# Patient Record
Sex: Male | Born: 1985 | Race: White | Hispanic: No | Marital: Single | Smoking: Current every day smoker
Health system: Southern US, Community
[De-identification: ages and names within clinical notes are randomized; demographics above are authoritative.]

## PROBLEM LIST (undated history)

## (undated) DIAGNOSIS — L732 Hidradenitis suppurativa: Secondary | ICD-10-CM

## (undated) DIAGNOSIS — K219 Gastro-esophageal reflux disease without esophagitis: Secondary | ICD-10-CM

---

## 2010-01-09 ENCOUNTER — Ambulatory Visit: Payer: Self-pay | Admitting: Family Medicine

## 2010-01-09 DIAGNOSIS — S61209A Unspecified open wound of unspecified finger without damage to nail, initial encounter: Secondary | ICD-10-CM | POA: Insufficient documentation

## 2010-01-19 ENCOUNTER — Ambulatory Visit: Payer: Self-pay | Admitting: Family Medicine

## 2010-12-07 NOTE — Assessment & Plan Note (Signed)
Summary: Cut R middle finger x today proced rm   Vital Signs:  Patient Profile:   25 Years Old Male CC:      Laceration - L middle finger x today Height:     69 inches Weight:      257 pounds O2 Sat:      100 % O2 treatment:    Room Air Temp:     97.5 degrees F oral Pulse rate:   64 / minute Pulse rhythm:   regular Resp:     16 per minute BP sitting:   129 / 75  (right arm) Cuff size:   regular  Vitals Entered By: Areta Haber CMA (January 09, 2010 1:21 PM)                  Current Allergies: No known allergies History of Present Illness Chief Complaint: Laceration - L middle finger x today History of Present Illness: Subjective:  Patient complains of cutting his left 3rd fingertip on the sharp edge of a TV this afternoon.  He does not remember his last Td.  Current Problems: LACERATION OF FINGER (ICD-883.0)   Current Meds LORTAB 5 5-500 MG TABS (HYDROCODONE-ACETAMINOPHEN) One or two tabs by mouth q4 to 6hr as needed pain  REVIEW OF SYSTEMS Constitutional Symptoms      Denies fever, chills, night sweats, weight loss, weight gain, and fatigue.  Eyes       Denies change in vision, eye pain, eye discharge, glasses, contact lenses, and eye surgery. Ear/Nose/Throat/Mouth       Denies hearing loss/aids, change in hearing, ear pain, ear discharge, dizziness, frequent runny nose, frequent nose bleeds, sinus problems, sore throat, hoarseness, and tooth pain or bleeding.  Respiratory       Denies dry cough, productive cough, wheezing, shortness of breath, asthma, bronchitis, and emphysema/COPD.  Cardiovascular       Denies murmurs, chest pain, and tires easily with exhertion.    Gastrointestinal       Denies stomach pain, nausea/vomiting, diarrhea, constipation, blood in bowel movements, and indigestion. Genitourniary       Denies painful urination, kidney stones, and loss of urinary control. Neurological       Denies paralysis, seizures, and  fainting/blackouts. Musculoskeletal       Denies muscle pain, joint pain, joint stiffness, decreased range of motion, redness, swelling, muscle weakness, and gout.  Skin       Denies bruising, unusual mles/lumps or sores, and hair/skin or nail changes.  Psych       Denies mood changes, temper/anger issues, anxiety/stress, speech problems, depression, and sleep problems. Other Comments: Cut Left middle finger moving TV today.   Social History: Single Current Smoker - 1-11/2 packs daily Alcohol use-yes, socially Drug use-no Regular exercise-yes Smoking Status:  current Drug Use:  no Does Patient Exercise:  yes   Objective:  No acute distress  Left 3rd fingertip:  3cm flap laceration volar surface distal phalanx; full range of motion all joints.  Distal neurovascular intact  Assessment New Problems: LACERATION OF FINGER (ICD-883.0)   Plan New Medications/Changes: LORTAB 5 5-500 MG TABS (HYDROCODONE-ACETAMINOPHEN) One or two tabs by mouth q4 to 6hr as needed pain  #10 (ten) x 0, 01/09/2010, Donna Christen MD  New Orders: Tdap => 79yrs IM [90715] Tdap => 53yrs IM [90715] Admin 1st Vaccine [90471] Admin 1st Vaccine [facility]) [phone] Repair Superficial Wound(s) 2.6cm to 7.5cm [12002] New Patient Level III [04540] Planning Comments:   Tdap given Applied Bacitracin  and bandaid. Advised to change bandage daily and apply Bacitracin.  Wound precaustions.  Rx for analgesic.  Suture removal 10 days.   The patient and/or caregiver has been counseled thoroughly with regard to medications prescribed including dosage, schedule, interactions, rationale for use, and possible side effects and they verbalize understanding.  Diagnoses and expected course of recovery discussed and will return if not improved as expected or if the condition worsens. Patient and/or caregiver verbalized understanding.   PROCEDURE:  Suture Site: Left 3rd finger Size: 3cm Number of Lacerations: One Anesthesia:  Digital 2% plain lidocaine Procedure: Procedure:  Laceration Repair Discussed benefits and risks of procedure and verbal consent obtained. Using sterile technique and local digital 2% lidocaine without epinephrine, cleansed wound with Betadine followed by copious lavage with normal saline.  Wound carefully inspected for debris and foreign bodies; none found.  Wound closed with # 7, 4-0 interrupted nylon sutures.  Bacitracin and non-stick sterile dressing applied.  Wound precautions explained to patient.    Disposition: Home Prescriptions: LORTAB 5 5-500 MG TABS (HYDROCODONE-ACETAMINOPHEN) One or two tabs by mouth q4 to 6hr as needed pain  #10 (ten) x 0   Entered and Authorized by:   Donna Christen MD   Signed by:   Donna Christen MD on 01/09/2010   Method used:   Print then Give to Patient   RxID:   [id number]    Tetanus/Td Vaccine    Vaccine Type: Tdap    Site: left deltoid    Mfr: GlaxoSmithKline    Dose: 0.5 ml    Route: IM    Given by: Areta Haber CMA    Exp. Date: 01/02/2012    Lot #: [id number]    VIS given: 09/25/07 version given January 09, 2010.

## 2010-12-07 NOTE — Miscellaneous (Signed)
Summary: Immunizations  Immunizations   Imported By: Joanne Chars CMA 01/09/2010 14:47:52  _____________________________________________________________________  External Attachment:    Type:   Image     Comment:   External Document

## 2010-12-07 NOTE — Assessment & Plan Note (Signed)
Summary: Suture Removal  CHIEF COMPLAINT:Suture removal from left middle finger x 10 days  VITAL SIGNS:     Height:    (Previous:   70      )     Weight:   (Previous:  250       )     Temp: 97.9     BP: 142/75     Pulse:75     Resp:16     02 Sat:99%  ALLERGIES:  NKDA  Past History, Family History, Social History previously recorded   Objective:  Left third finger tip:  mildly tender volar surface but no swelling.  Small amount of blood noted on bandage  Assessment:  ENCOUNTER CHG/REMOVAL NONSURGICAL WOUND DRESSING (ICD-V58.30) ? Mild cellulits  Plan:  CEPHALEXIN 500 MG TABS (CEPHALEXIN) One by mouth three times daily (every 8 hours) Continue daily dressing change with Bacitracin and non-stick dressing Return 4 or 5 days for suture removal.

## 2020-08-26 DIAGNOSIS — M549 Dorsalgia, unspecified: Secondary | ICD-10-CM | POA: Diagnosis not present

## 2020-08-26 DIAGNOSIS — R61 Generalized hyperhidrosis: Secondary | ICD-10-CM | POA: Diagnosis not present

## 2020-08-26 DIAGNOSIS — N5089 Other specified disorders of the male genital organs: Secondary | ICD-10-CM | POA: Diagnosis not present

## 2020-08-28 ENCOUNTER — Other Ambulatory Visit: Payer: Self-pay | Admitting: Family Medicine

## 2020-08-28 DIAGNOSIS — N5089 Other specified disorders of the male genital organs: Secondary | ICD-10-CM

## 2020-09-03 ENCOUNTER — Other Ambulatory Visit: Payer: Self-pay

## 2020-09-03 ENCOUNTER — Ambulatory Visit
Admission: RE | Admit: 2020-09-03 | Discharge: 2020-09-03 | Disposition: A | Discharge status: Home / Self Care | Payer: BC Managed Care – PPO | Source: Ambulatory Visit | Attending: Family Medicine | Admitting: Family Medicine

## 2020-09-03 DIAGNOSIS — N5089 Other specified disorders of the male genital organs: Secondary | ICD-10-CM

## 2020-12-31 ENCOUNTER — Ambulatory Visit: Payer: Self-pay | Admitting: General Surgery

## 2020-12-31 NOTE — H&P (Signed)
Brian Hodges Appointment: 12/31/2020 9:45 AM Location: [facility] Patient #: [id number] DOB: [birth year] Single / Language: Brian Hodges / Race: White Male  History of Present Illness Brian Hiss M. Brian Hodges; 12/31/2020 2:46 PM) The patient is a 35 year old male who presents with an inguinal hernia. He is referred by Dr Brian Hodges for evaluation of left inguinal hernia. The patient states that a few months ago he awoke or noticed swelling in his left testicle. He didn't know what had happened. It was just a large bulge in his left scrotum. He saw his primary care doctor and underwent a scrotal ultrasound. There was concern as it could be a large hydrocele. His ultrasound showed no evidence of hydrocele or varicocele and normal testicles. He had left inguinal hernia containing fat and likely bowel. He states occasionally it will cause discomfort. He denies any neuropathic pain. He denies any trouble urinating. He denies any diarrhea or constipation. He states that these had some skin issues in his pubic area in the past. He appears have a small fat-containing umbilical hernia as well.  He does smoke 1 pack per day. He also smokes marijuana occasionally.  He also states that he has had some erectile dysfunction since noticing the bulge. He has tried Viagra without any improvement   Problem List/Past Medical Brian Hiss M. 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 Caffeine use Carbonated beverages, Tea. Illicit drug use Uses daily. Tobacco use Current every day smoker.  Family History Brian Hodges; 12/31/2020 9:53 AM) First Degree Relatives No pertinent family history  Other Problems Brian Hiss M. Redmond Pulling, Hodges; 12/31/2020 2:47 PM) Back Pain     Review of Systems Brian Hiss M. Brian Hodges; 12/31/2020 2:43 PM) General Present- Fatigue and Weight Gain. Not Present- Appetite Loss, Chills, Fever, Night Sweats and Weight Loss. Skin Not Present- Change in Wart/Mole, Dryness, Hives, Jaundice, New Lesions, Non-Healing Wounds, Rash and Ulcer. HEENT Not Present- Earache, Hearing Loss, Hoarseness, Nose Bleed, Oral Ulcers, Ringing in the Ears, Seasonal Allergies, Sinus Pain, Sore Throat, Visual Disturbances, Wears glasses/contact lenses and Yellow Eyes. Respiratory Present- Snoring. Not Present- Bloody sputum, Chronic Cough, Difficulty Breathing and Wheezing. Breast Not Present- Breast Mass, Breast Pain, Nipple Discharge and Skin Changes. Cardiovascular Present- Leg Cramps. Not Present- Chest Pain, Difficulty Breathing Lying Down, Palpitations, Rapid Heart Rate, Shortness of Breath and Swelling of Extremities. Gastrointestinal Not Present- Abdominal Pain, Bloating, Bloody Stool, Change in Bowel Habits, Chronic diarrhea, Constipation, Difficulty Swallowing, Excessive gas, Gets full quickly at meals, Hemorrhoids, Indigestion, Nausea, Rectal Pain and Vomiting. Male Genitourinary Present- Impotence. Not Present- Blood in Urine, Change in Urinary Stream, Frequency, Nocturia, Painful Urination, Urgency and Urine Leakage. Musculoskeletal Present- Back Pain and Joint Stiffness. Not Present- Joint Pain, Muscle Pain, Muscle Weakness and Swelling of Extremities. Neurological Not Present- Decreased Memory, Fainting, Headaches, Numbness, Seizures, Tingling, Tremor, Trouble  walking and Weakness. Hematology Not Present- Blood Thinners, Easy Bruising, Excessive bleeding, Gland problems, HIV and Persistent Infections. All other systems negative  Vitals (Brian Hodges, early years/pre;  12/31/2020 9:54 AM) 12/31/2020 9:54 AM Weight: 285.38 lb Height: 70in Body Surface Area: 2.43 m Body Mass Index: 40.95 kg/m  Temp.: 98.46F  Pulse: 108 (Regular)  P.OX: 98% (Room air) BP: 142/84(Sitting, Left Arm, Standard)        Physical Exam Brian Hiss M. Brian Hodges; 12/31/2020 2:43 PM)  The physical exam findings are as follows: Note:severe obesity, mainly central  General Mental Status-Alert. General Appearance-Consistent with stated age. Hydration-Well hydrated. Voice-Normal.  Integumentary Note: small pimples across abdomen in suprapubic area - several old skin scars from what appears to be healed abscesses/boils  Head and Neck Head-normocephalic, atraumatic with no lesions or palpable masses. Trachea-midline. Thyroid Gland Characteristics - normal size and consistency.  Eye Eyeball - Bilateral-Extraocular movements intact. Sclera/Conjunctiva - Bilateral-No scleral icterus.  Chest and Lung Exam Chest and lung exam reveals -quiet, even and easy respiratory effort with no use of accessory muscles and on auscultation, normal breath sounds, no adventitious sounds and normal vocal resonance. Inspection Chest Wall - Normal. Back - normal.  Breast - Did not examine.  Cardiovascular Cardiovascular examination reveals -normal heart sounds, regular rate and rhythm with no murmurs and normal pedal pulses bilaterally.  Abdomen Inspection Inspection of the abdomen reveals - No Hernias. Skin - Scar - no surgical scars. Palpation/Percussion Palpation and Percussion of the abdomen reveal - Soft, Non Tender, No Rebound tenderness, No Rigidity (guarding) and No hepatosplenomegaly. Auscultation Auscultation of the abdomen reveals - Bowel sounds  normal.  Male Genitourinary Note: large firm L inguinoscrotal mass/bulge - nontender, unable to reduce, palpable L testicle - no masses; no bulge - right inguinal area with valsalva  Peripheral Vascular Upper Extremity Palpation - Pulses bilaterally normal.  Neurologic Neurologic evaluation reveals -alert and oriented x 3 with no impairment of recent or remote memory. Mental Status-Normal.  Neuropsychiatric The patient's mood and affect are described as -normal. Judgment and Insight-insight is appropriate concerning matters relevant to self.  Musculoskeletal Normal Exam - Left-Upper Extremity Strength Normal and Lower Extremity Strength Normal. Normal Exam - Right-Upper Extremity Strength Normal and Lower Extremity Strength Normal.  Lymphatic Head & Neck  General Head & Neck Lymphatics: Bilateral - Description - Normal. Axillary - Did not examine. Femoral & Inguinal - Did not examine.    Assessment & Plan Brian Hiss M. Breaunna Gottlieb Hodges; 12/31/2020 2:47 PM)  INCARCERATED LEFT INGUINAL HERNIA (K40.30) Impression: We discussed the etiology of inguinal hernias. We discussed the signs & symptoms of incarceration & strangulation. We discussed non-operative and operative management. He has a chronically incarcerated fat-containing inguinal hernia.  We discussed both open approaches as well as minimally invasive approaches such as a robotic approach. I discussed the pros and cons of each approach. My concern with an open approach and is he appears to have recurring small skin boils, abscesses in his lower abdomen and suprapubic area. I'm concerned about an open incision being at increased risk for infection therefore I recommended a minimally invasive approach even though he has a large inguinal scrotal hernia which may be a little bit more technically challenging to reduce through a minimally invasive approach  The patient has elected to proceed with robotic repair of chronically  incarcerated left inguinal hernia with mesh  I described the procedure in detail. The patient was given educational material. We discussed the risks and benefits including but not limited to bleeding, infection, chronic inguinal pain, nerve entrapment, hernia recurrence, mesh complications, hematoma formation, urinary retention, injury to the testicle, numbness in the groin, blood clots, injury to  the surrounding structures, and anesthesia risk. We also discussed the typical post operative recovery course, including no heavy lifting for 4-6 weeks. I explained that the likelihood of improvement of their symptoms is good  I explained that his obesity and tobacco use does increase his risk of recurrence  This patient encounter took 39 minutes today to perform the following: take history, perform exam, review outside records, interpret imaging, counsel the patient on their diagnosis and document encounter, findings & plan in the EHR  Current Plans You are being scheduled for surgery- Our schedulers will call youonce we get approval from your worker's compensation agency  You should hear from our office's scheduling department within 7 working days about the location, date, and time of surgery. We try to make accommodations for patient's preferences in scheduling surgery, but sometimes the OR schedule or the surgeon's schedule prevents [place] from making those accommodations.  If you have not heard from our office [phone]) in 7 working days, call the office and ask for your surgeon's nurse.  If you have other questions about your diagnosis, plan, or surgery, call the office and ask for your surgeon's nurse.   TOBACCO USE (Z72.0) Impression: We discussed the importance of tobacco cessation with respect to infection, hernia recurrence. He was given Neurosurgeon  Current Plans Pt Education - Smoking: Ways to Quit: tobacco  SEVERE OBESITY (E66.01)   OTHER MALE ERECTILE DYSFUNCTION  (N52.8) Impression: I don't know if it is inguinal hernia is causing his erectile dysfunction I think he would be unlikely. I think they're more likely causes. I encouraged him to discuss it with his PCP and consider referral to urology.  Leighton Ruff. Redmond Pulling, Hodges, FACS General, Bariatric, & Minimally Invasive Surgery [facility], [place]

## 2021-02-18 NOTE — Patient Instructions (Addendum)
DUE TO COVID-19 ONLY ONE VISITOR IS ALLOWED TO COME WITH YOU AND STAY IN THE WAITING ROOM ONLY DURING PRE OP AND PROCEDURE DAY OF SURGERY. THE 1 VISITOR  MAY VISIT WITH YOU AFTER SURGERY IN YOUR PRIVATE ROOM DURING VISITING HOURS ONLY!  YOU NEED TO HAVE A COVID 19 TEST ON    4/26___ @_3 :05______, THIS TEST MUST BE DONE BEFORE SURGERY,  COVID TESTING SITE [place] [address], IT IS ON THE RIGHT GOING OUT [address] APPROXIMATELY  2 MINUTES PAST [employer] ON THE RIGHT. ONCE YOUR COVID TEST IS COMPLETED,  PLEASE BEGIN THE QUARANTINE INSTRUCTIONS AS OUTLINED IN YOUR HANDOUT.                [place]    Your procedure is scheduled on: 03/04/21   Report to [facility] Main  Entrance   Report to admitting at  6:30 AM     Call this number if you have problems the morning of surgery [place], NO CHEWING GUM [place].   No food after midnight.    You may have clear liquid until 5:30 AM.    At 5:00 AM drink pre surgery drink.   Nothing by mouth after 5:30 AM.  none Take these medicines the morning of surgery with A SIP OF WATER:   DO NOT TAKE ANY DIABETIC MEDICATIONS DAY OF YOUR SURGERY                               You may not have any metal on your body including              piercings  Do not wear jewelry,  lotions, powders or deodorant              Men may shave face and neck.   Do not bring valuables to the hospital. [place].  Contacts, dentures or bridgework may not be worn into surgery.      Patients discharged the day of surgery will not be allowed to drive home.  IF YOU ARE HAVING SURGERY AND GOING HOME THE SAME DAY, YOU MUST HAVE AN ADULT TO DRIVE YOU HOME AND BE WITH YOU FOR 24 HOURS. YOU MAY GO HOME BY TAXI OR UBER OR ORTHERWISE, BUT AN ADULT MUST ACCOMPANY YOU HOME AND STAY WITH YOU FOR 24  HOURS.  Name and phone number of your driver:  Special Instructions: N/A              Please read over the following fact sheets you were given: _____________________________________________________________________             [facility] - Preparing for Surgery Before surgery, you can play an important role.  Because skin is not sterile, your skin needs to be as free of germs as possible.  You can reduce the number of germs on your skin by washing with CHG (chlorahexidine gluconate) soap before surgery.  CHG is an antiseptic cleaner which kills germs and bonds with the skin to continue killing germs even after washing. Please DO NOT use if you have an allergy to CHG or antibacterial soaps.  If your skin becomes reddened/irritated stop using the CHG and inform your nurse  when you arrive at Short Stay. You may shave your face/neck. Please follow these instructions carefully:   1.  Shower with CHG Soap the night before surgery and the  morning of Surgery.  2.  If you choose to wash your hair, wash your hair first as usual with your  normal  shampoo.  3.  After you shampoo, rinse your hair and body thoroughly to remove the  shampoo.                                        4.  Use CHG as you would any other liquid soap.  You can apply chg directly  to the skin and wash                       Gently with a scrungie or clean washcloth.  5.  Apply the CHG Soap to your body ONLY FROM THE NECK DOWN.   Do not use on face/ open                           Wound or open sores. Avoid contact with eyes, ears mouth and genitals (private parts).                       Wash face,  Genitals (private parts) with your normal soap.             6.  Wash thoroughly, paying special attention to the area where your surgery  will be performed.  7.  Thoroughly rinse your body with warm water from the neck down.  8.  DO NOT shower/wash with your normal soap after using and rinsing off  the CHG Soap.             9.  Pat  yourself dry with a clean towel.            10.  Wear clean pajamas.            11.  Place clean sheets on your bed the night of your first shower and do not  sleep with pets. Day of Surgery : Do not apply any lotions/deodorants the morning of surgery.  Please wear clean clothes to the hospital/surgery center.  FAILURE TO FOLLOW THESE INSTRUCTIONS MAY RESULT IN THE CANCELLATION OF YOUR SURGERY PATIENT SIGNATURE_________________________________  NURSE SIGNATURE__________________________________  ________________________________________________________________________

## 2021-02-22 ENCOUNTER — Other Ambulatory Visit: Payer: Self-pay

## 2021-02-22 ENCOUNTER — Encounter (HOSPITAL_COMMUNITY): Payer: Self-pay

## 2021-02-22 ENCOUNTER — Encounter (HOSPITAL_COMMUNITY)
Admission: RE | Admit: 2021-02-22 | Discharge: 2021-02-22 | Disposition: A | Discharge status: Home / Self Care | Payer: 59 | Source: Ambulatory Visit | Attending: General Surgery | Admitting: General Surgery

## 2021-02-22 DIAGNOSIS — Z01812 Encounter for preprocedural laboratory examination: Secondary | ICD-10-CM | POA: Diagnosis present

## 2021-02-22 HISTORY — DX: Gastro-esophageal reflux disease without esophagitis: K21.9

## 2021-02-22 LAB — CBC WITH DIFFERENTIAL/PLATELET
Abs Immature Granulocytes: 0.01 10*3/uL (ref 0.00–0.07)
Basophils Absolute: 0.1 10*3/uL (ref 0.0–0.1)
Basophils Relative: 1 %
Eosinophils Absolute: 0.4 10*3/uL (ref 0.0–0.5)
Eosinophils Relative: 4 %
HCT: 43.4 % (ref 39.0–52.0)
Hemoglobin: 13.7 g/dL (ref 13.0–17.0)
Immature Granulocytes: 0 %
Lymphocytes Relative: 31 %
Lymphs Abs: 2.8 10*3/uL (ref 0.7–4.0)
MCH: 28.8 pg (ref 26.0–34.0)
MCHC: 31.6 g/dL (ref 30.0–36.0)
MCV: 91.2 fL (ref 80.0–100.0)
Monocytes Absolute: 0.6 10*3/uL (ref 0.1–1.0)
Monocytes Relative: 7 %
Neutro Abs: 5.1 10*3/uL (ref 1.7–7.7)
Neutrophils Relative %: 57 %
Platelets: 286 10*3/uL (ref 150–400)
RBC: 4.76 MIL/uL (ref 4.22–5.81)
RDW: 13.2 % (ref 11.5–15.5)
WBC: 9 10*3/uL (ref 4.0–10.5)
nRBC: 0 % (ref 0.0–0.2)

## 2021-02-22 LAB — BASIC METABOLIC PANEL
Anion gap: 10 (ref 5–15)
BUN: 14 mg/dL (ref 6–20)
CO2: 26 mmol/L (ref 22–32)
Calcium: 9.3 mg/dL (ref 8.9–10.3)
Chloride: 103 mmol/L (ref 98–111)
Creatinine, Ser: 0.88 mg/dL (ref 0.61–1.24)
GFR, Estimated: >60 mL/min (ref >60)
Glucose, Bld: 120 mg/dL — ABNORMAL HIGH (ref 70–99)
Potassium: 4.2 mmol/L (ref 3.5–5.1)
Sodium: 139 mmol/L (ref 135–145)

## 2021-02-22 NOTE — Progress Notes (Signed)
COVID Vaccine Completed:Yes Date COVID Vaccine completed:10/2020 COVID vaccine manufacturer: Pfizer    PCP - Dr. Leia Alf Cardiologist - none  Chest x-ray - no EKG - no Stress Test - no ECHO - no Cardiac Cath - no Pacemaker/ICD device last checked:NA  Sleep Study - no CPAP -   Fasting Blood Sugar - NA Checks Blood Sugar _____ times a day  Blood Thinner Instructions:NA Aspirin Instructions: Last Dose:  Anesthesia review:   Patient denies shortness of breath, fever, cough and chest pain at PAT appointment yes  Patient verbalized understanding of instructions that were given to them at the PAT appointment. Patient was also instructed that they will need to review over the PAT instructions again at home before surgery.yes  Pt can climb 3 flights of stairs, do work around the house and ADLs with out SOB. He reports a 12 Lb weight gain over the last 2 years and he hasn't been able to work out because of the hernias

## 2021-03-01 ENCOUNTER — Other Ambulatory Visit (HOSPITAL_COMMUNITY)
Admission: RE | Admit: 2021-03-01 | Discharge: 2021-03-01 | Disposition: A | Discharge status: Home / Self Care | Payer: 59 | Source: Ambulatory Visit | Attending: General Surgery | Admitting: General Surgery

## 2021-03-01 DIAGNOSIS — Z01812 Encounter for preprocedural laboratory examination: Secondary | ICD-10-CM | POA: Diagnosis present

## 2021-03-01 DIAGNOSIS — Z20822 Contact with and (suspected) exposure to covid-19: Secondary | ICD-10-CM | POA: Diagnosis not present

## 2021-03-01 LAB — SARS CORONAVIRUS 2 (TAT 6-24 HRS): SARS Coronavirus 2: NEGATIVE

## 2021-03-03 MED ORDER — CEFAZOLIN IN SODIUM CHLORIDE 3-0.9 GM/100ML-% IV SOLN
3.0000 g | INTRAVENOUS | Status: AC
  Administered 2021-03-04: 3 g via INTRAVENOUS
  Filled 2021-03-03: qty 100

## 2021-03-03 NOTE — Anesthesia Preprocedure Evaluation (Addendum)
Anesthesia Evaluation  Patient identified by MRN, date of birth, ID band Patient awake    Reviewed: Allergy & Precautions, NPO status , Patient's Chart, lab work & pertinent test results  Airway Mallampati: III  TM Distance: >3 FB Neck ROM: Full    Dental  (+) Teeth Intact   Pulmonary neg pulmonary ROS, Current Smoker and Patient abstained from smoking.,    Pulmonary exam normal        Cardiovascular negative cardio ROS   Rhythm:Regular Rate:Normal     Neuro/Psych negative neurological ROS  negative psych ROS   GI/Hepatic Neg liver ROS, GERD  ,Incarcerated inguinal hernia   Endo/Other  negative endocrine ROS  Renal/GU   negative genitourinary   Musculoskeletal negative musculoskeletal ROS (+)   Abdominal (+)  Abdomen: soft. Bowel sounds: normal.  Peds  Hematology negative hematology ROS (+)   Anesthesia Other Findings   Reproductive/Obstetrics                            Anesthesia Physical Anesthesia Plan  ASA: II  Anesthesia Plan: General   Post-op Pain Management:    Induction: Intravenous  PONV Risk Score and Plan: 1 and Ondansetron, Dexamethasone, Midazolam and Treatment may vary due to age or medical condition  Airway Management Planned: Mask and Oral ETT  Additional Equipment: None  Intra-op Plan:   Post-operative Plan: Extubation in OR  Informed Consent: I have reviewed the patients History and Physical, chart, labs and discussed the procedure including the risks, benefits and alternatives for the proposed anesthesia with the patient or authorized representative who has indicated his/her understanding and acceptance.     Dental advisory given  Plan Discussed with: CRNA  Anesthesia Plan Comments: (Lab Results      Component                Value               Date                      WBC                      9.0                 02/22/2021                HGB                       13.7                02/22/2021                HCT                      43.4                02/22/2021                MCV                      91.2                02/22/2021                PLT  286                 02/22/2021           Lab Results      Component                Value               Date                      NA                       139                 02/22/2021                K                        4.2                 02/22/2021                CO2                      26                  02/22/2021                GLUCOSE                  120 (H)             02/22/2021                BUN                      14                  02/22/2021                CREATININE               0.88                02/22/2021                CALCIUM                  9.3                 02/22/2021                GFRNONAA                 >60                 02/22/2021          )       Anesthesia Quick Evaluation

## 2021-03-04 ENCOUNTER — Ambulatory Visit (HOSPITAL_COMMUNITY)
Admission: RE | Admit: 2021-03-04 | Discharge: 2021-03-04 | Disposition: A | Discharge status: Home / Self Care | Payer: 59 | Source: Ambulatory Visit | Attending: General Surgery | Admitting: General Surgery

## 2021-03-04 ENCOUNTER — Encounter (HOSPITAL_COMMUNITY)
Admission: RE | Disposition: A | Discharge status: Home / Self Care | Payer: Self-pay | Source: Ambulatory Visit | Attending: General Surgery

## 2021-03-04 ENCOUNTER — Ambulatory Visit (HOSPITAL_COMMUNITY): Payer: 59 | Admitting: Certified Registered"

## 2021-03-04 ENCOUNTER — Encounter (HOSPITAL_COMMUNITY): Payer: Self-pay | Admitting: General Surgery

## 2021-03-04 DIAGNOSIS — E669 Obesity, unspecified: Secondary | ICD-10-CM | POA: Diagnosis not present

## 2021-03-04 DIAGNOSIS — K429 Umbilical hernia without obstruction or gangrene: Secondary | ICD-10-CM | POA: Insufficient documentation

## 2021-03-04 DIAGNOSIS — D176 Benign lipomatous neoplasm of spermatic cord: Secondary | ICD-10-CM | POA: Insufficient documentation

## 2021-03-04 DIAGNOSIS — Z6839 Body mass index (BMI) 39.0-39.9, adult: Secondary | ICD-10-CM | POA: Diagnosis not present

## 2021-03-04 DIAGNOSIS — N529 Male erectile dysfunction, unspecified: Secondary | ICD-10-CM | POA: Diagnosis not present

## 2021-03-04 DIAGNOSIS — K4030 Unilateral inguinal hernia, with obstruction, without gangrene, not specified as recurrent: Secondary | ICD-10-CM | POA: Insufficient documentation

## 2021-03-04 DIAGNOSIS — L989 Disorder of the skin and subcutaneous tissue, unspecified: Secondary | ICD-10-CM | POA: Insufficient documentation

## 2021-03-04 DIAGNOSIS — D1779 Benign lipomatous neoplasm of other sites: Secondary | ICD-10-CM | POA: Diagnosis not present

## 2021-03-04 DIAGNOSIS — F17210 Nicotine dependence, cigarettes, uncomplicated: Secondary | ICD-10-CM | POA: Diagnosis not present

## 2021-03-04 HISTORY — PX: XI ROBOTIC ASSISTED INGUINAL HERNIA REPAIR WITH MESH: SHX6706

## 2021-03-04 IMAGING — US US SCROTUM W/ DOPPLER COMPLETE
1 series · 13 of 25 positions shown · non-contrast
Comparison: None

CLINICAL DATA: LEFT testicular swelling for 4-5 months, no pain, no
injury

EXAM:
SCROTAL ULTRASOUND
DOPPLER ULTRASOUND OF THE TESTICLES
TECHNIQUE: Complete ultrasound examination of the testicles, epididymis, and
other scrotal structures was performed. Color and spectral Doppler
ultrasound were also utilized to evaluate blood flow to the
testicles.

[Series 1: us scrotum w/ doppler complete · 0.06mm/px · 13 of 55 slices shown]
[im 1/55]
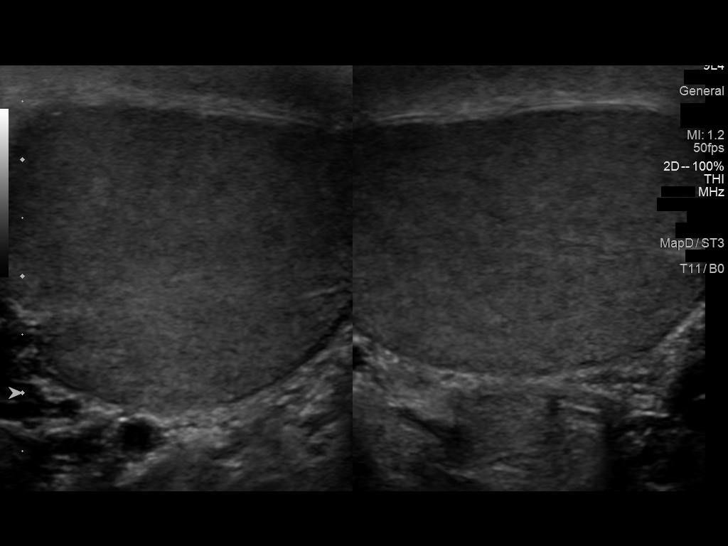
[im 5/55]
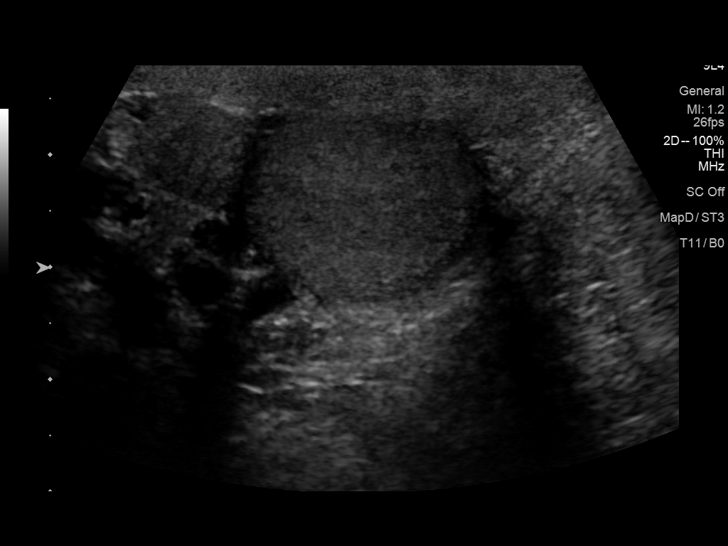
[im 10/55]
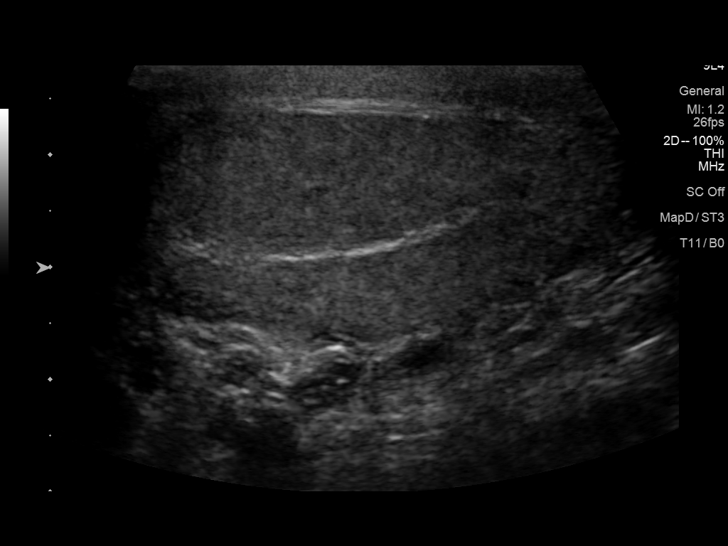
[im 14/55]
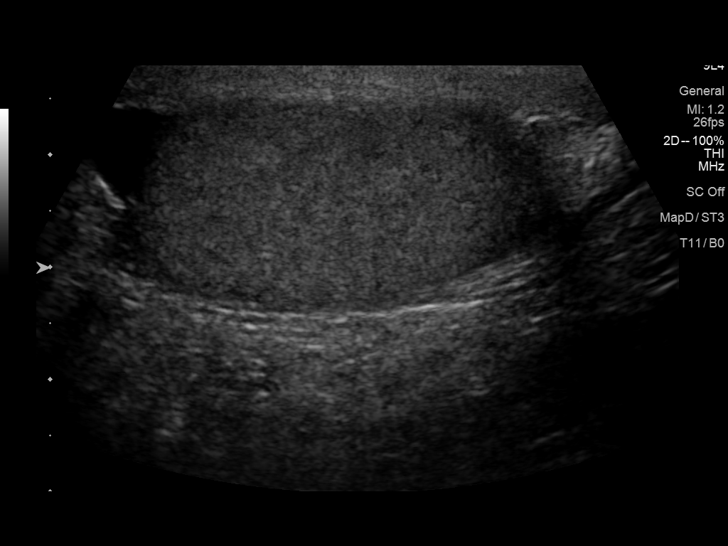
[im 19/55]
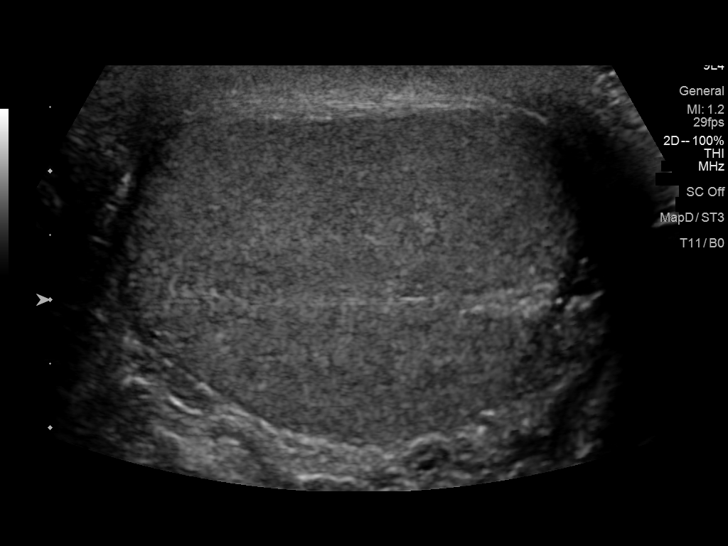
[im 23/55]
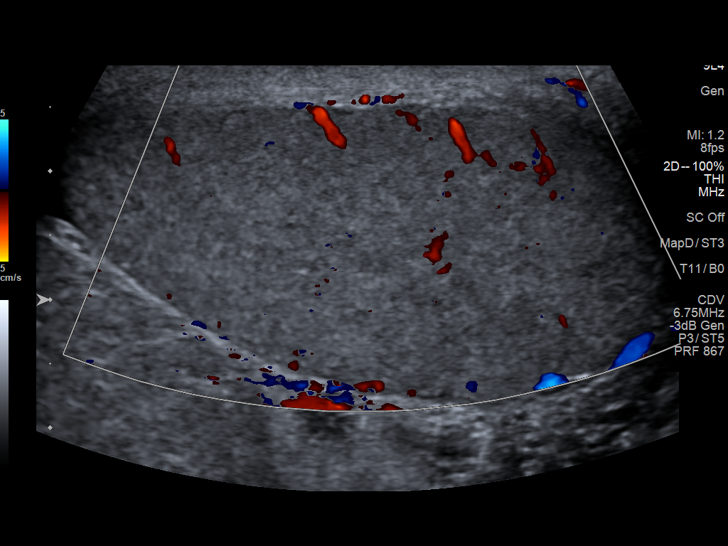
[im 28/55]
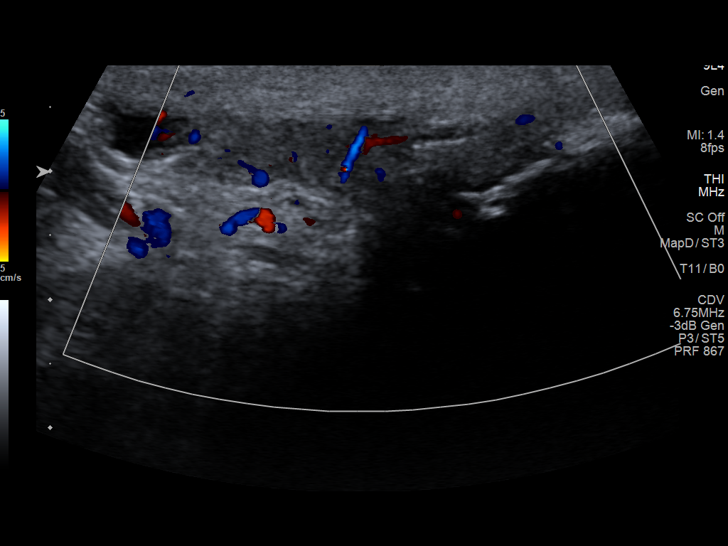
[im 32/55]
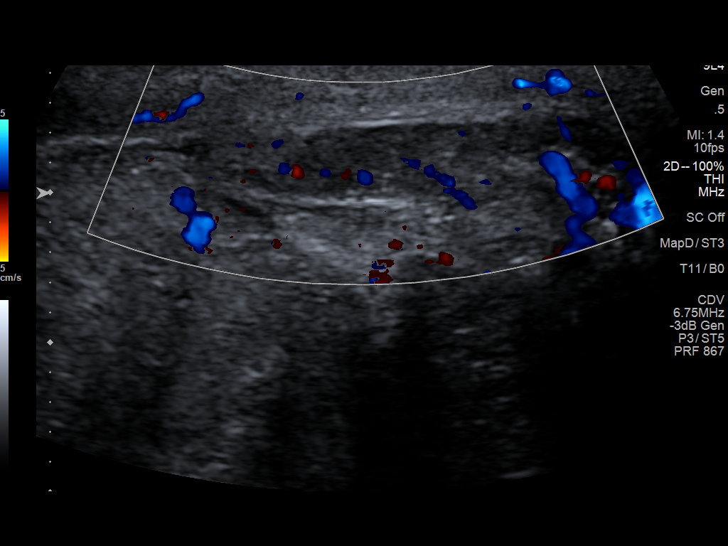
[im 37/55]
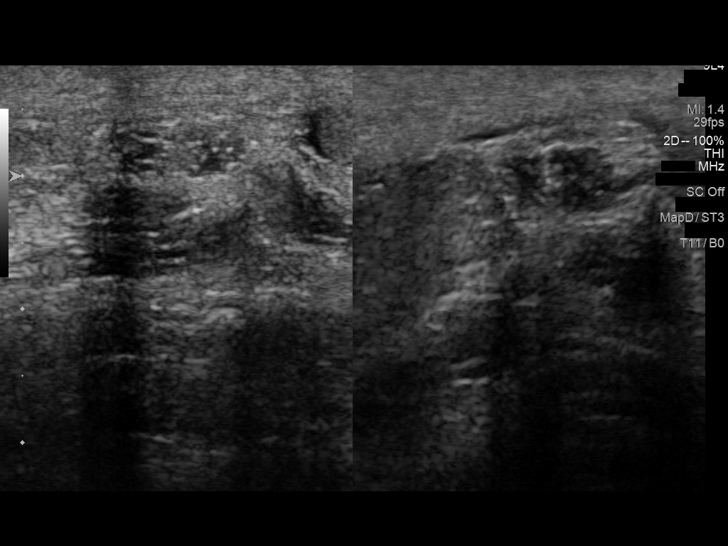
[im 41/55]
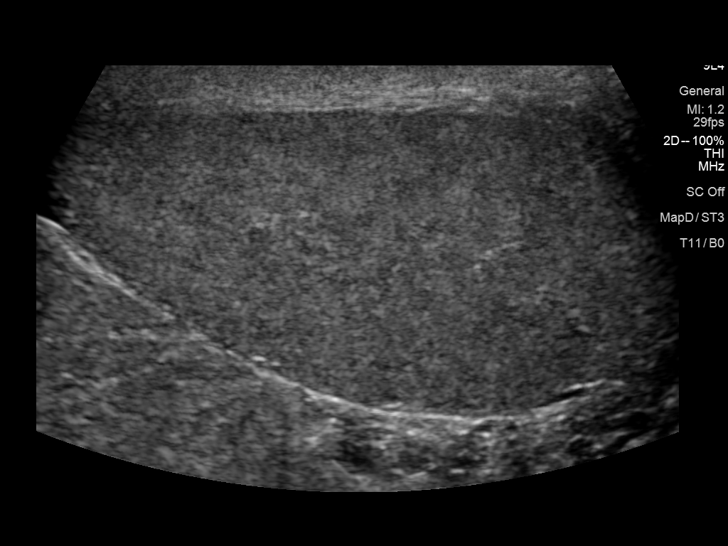
[im 46/55]
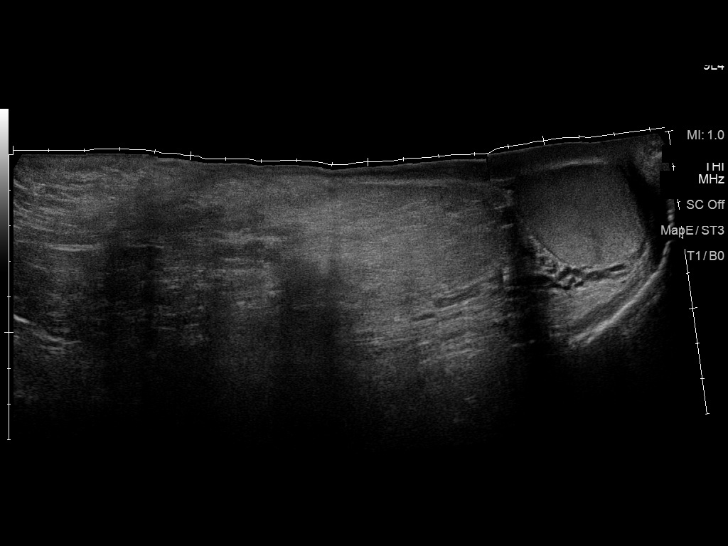
[im 50/55]
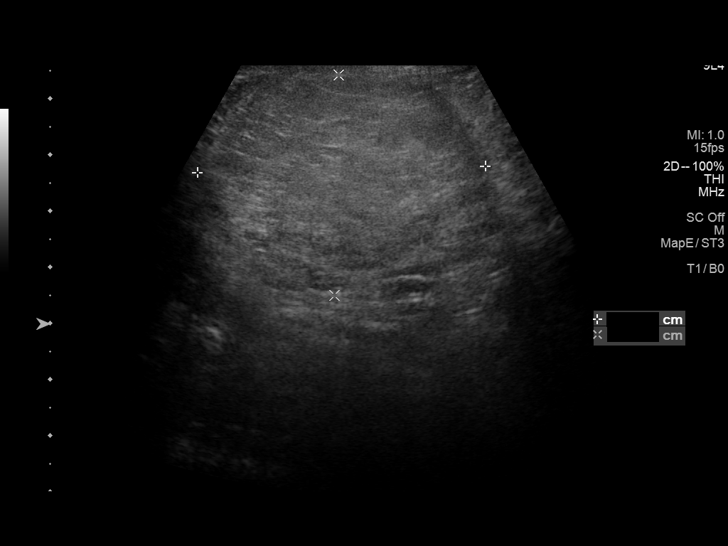
[im 55/55]
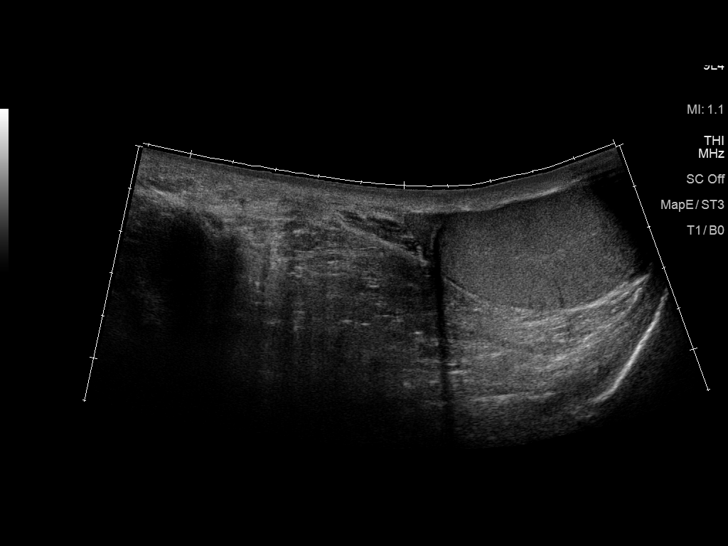

[13 of 25 positions shown; findings below may reference images not displayed]

FINDINGS: Right testicle

Measurements: 5.4 x 2.4 x 3.3 cm. Normal echogenicity without mass
or calcification. Internal blood flow present on color Doppler
imaging.

Left testicle

Measurements: 5.0 x 2.4 x 3.5 cm. Normal echogenicity without mass
or calcification. Internal blood flow present on color Doppler
imaging.

Right epididymis:  Normal in size and appearance.

Left epididymis:  Normal in size and appearance.

Hydrocele:  None visualized.

Varicocele:  None visualized.

Pulsed Doppler interrogation of both testes demonstrates normal low
resistance arterial and venous waveforms bilaterally.

Other: Fat echogenicity mass identified within the LEFT groin
consistent with a LEFT inguinal hernia. Initial cine series show
only fat without bowel. The last 2 cine series demonstrate
shadowing, suspicious for bowel extension into the hernia sac.
IMPRESSION: Normal appearing testes and epididymi bilaterally.

LEFT inguinal hernia containing fat and likely bowel.

## 2021-03-04 SURGERY — REPAIR, HERNIA, INGUINAL, ROBOT-ASSISTED, LAPAROSCOPIC, USING MESH
Anesthesia: General | Laterality: Left

## 2021-03-04 MED ORDER — SUGAMMADEX SODIUM 200 MG/2ML IV SOLN
INTRAVENOUS | Status: DC | PRN
  Administered 2021-03-04: 400 mg via INTRAVENOUS

## 2021-03-04 MED ORDER — KETOROLAC TROMETHAMINE 30 MG/ML IJ SOLN
INTRAMUSCULAR | Status: DC | PRN
  Administered 2021-03-04: 15 mg via INTRAVENOUS

## 2021-03-04 MED ORDER — OXYCODONE HCL 5 MG/5ML PO SOLN: 5.0000 mg | Freq: Once | ORAL | Status: DC | PRN

## 2021-03-04 MED ORDER — ROCURONIUM BROMIDE 10 MG/ML (PF) SYRINGE
PREFILLED_SYRINGE | INTRAVENOUS | Status: DC | PRN
  Administered 2021-03-04: 50 mg via INTRAVENOUS
  Administered 2021-03-04: 20 mg via INTRAVENOUS
  Administered 2021-03-04: 50 mg via INTRAVENOUS
  Administered 2021-03-04: 30 mg via INTRAVENOUS

## 2021-03-04 MED ORDER — ORAL CARE MOUTH RINSE: 15.0000 mL | Freq: Once | OROMUCOSAL | Status: AC

## 2021-03-04 MED ORDER — GABAPENTIN 300 MG PO CAPS
300.0000 mg | ORAL_CAPSULE | ORAL | Status: AC
  Administered 2021-03-04: 300 mg via ORAL
  Filled 2021-03-04: qty 1

## 2021-03-04 MED ORDER — PROPOFOL 10 MG/ML IV BOLUS
INTRAVENOUS | Status: AC
  Filled 2021-03-04: qty 40

## 2021-03-04 MED ORDER — OXYCODONE HCL 5 MG PO TABS: 5.0000 mg | ORAL_TABLET | Freq: Four times a day (QID) | ORAL | 0 refills | Status: DC | PRN

## 2021-03-04 MED ORDER — 0.9 % SODIUM CHLORIDE (POUR BTL) OPTIME
TOPICAL | Status: DC | PRN
  Administered 2021-03-04: 1000 mL

## 2021-03-04 MED ORDER — LIDOCAINE 2% (20 MG/ML) 5 ML SYRINGE
INTRAMUSCULAR | Status: AC
  Filled 2021-03-04: qty 25

## 2021-03-04 MED ORDER — ENSURE PRE-SURGERY PO LIQD
296.0000 mL | Freq: Once | ORAL | Status: DC
  Filled 2021-03-04: qty 296

## 2021-03-04 MED ORDER — BUPIVACAINE LIPOSOME 1.3 % IJ SUSP
20.0000 mL | Freq: Once | INTRAMUSCULAR | Status: AC
  Administered 2021-03-04: 20 mL
  Filled 2021-03-04: qty 20

## 2021-03-04 MED ORDER — CHLORHEXIDINE GLUCONATE 0.12 % MT SOLN
15.0000 mL | Freq: Once | OROMUCOSAL | Status: AC
  Administered 2021-03-04: 15 mL via OROMUCOSAL

## 2021-03-04 MED ORDER — CHLORHEXIDINE GLUCONATE CLOTH 2 % EX PADS: 6.0000 | MEDICATED_PAD | Freq: Once | CUTANEOUS | Status: DC

## 2021-03-04 MED ORDER — PROMETHAZINE HCL 25 MG/ML IJ SOLN: 6.2500 mg | INTRAMUSCULAR | Status: DC | PRN

## 2021-03-04 MED ORDER — GLYCOPYRROLATE PF 0.2 MG/ML IJ SOSY
PREFILLED_SYRINGE | INTRAMUSCULAR | Status: DC | PRN
  Administered 2021-03-04: .1 mg via INTRAVENOUS
  Administered 2021-03-04: .2 mg via INTRAVENOUS

## 2021-03-04 MED ORDER — LIDOCAINE 2% (20 MG/ML) 5 ML SYRINGE
INTRAMUSCULAR | Status: DC | PRN
  Administered 2021-03-04: 100 mg via INTRAVENOUS

## 2021-03-04 MED ORDER — ROCURONIUM BROMIDE 10 MG/ML (PF) SYRINGE
PREFILLED_SYRINGE | INTRAVENOUS | Status: AC
  Filled 2021-03-04: qty 10

## 2021-03-04 MED ORDER — MIDAZOLAM HCL 5 MG/5ML IJ SOLN
INTRAMUSCULAR | Status: DC | PRN
  Administered 2021-03-04: 2 mg via INTRAVENOUS

## 2021-03-04 MED ORDER — KETAMINE HCL 10 MG/ML IJ SOLN
INTRAMUSCULAR | Status: DC | PRN
  Administered 2021-03-04 (×2): 20 mg via INTRAVENOUS

## 2021-03-04 MED ORDER — LACTATED RINGERS IV SOLN: INTRAVENOUS | Status: DC

## 2021-03-04 MED ORDER — ONDANSETRON HCL 4 MG/2ML IJ SOLN
INTRAMUSCULAR | Status: AC
  Filled 2021-03-04: qty 2

## 2021-03-04 MED ORDER — LIDOCAINE 2% (20 MG/ML) 5 ML SYRINGE
INTRAMUSCULAR | Status: AC
  Filled 2021-03-04: qty 5

## 2021-03-04 MED ORDER — BUPIVACAINE-EPINEPHRINE (PF) 0.25% -1:200000 IJ SOLN
INTRAMUSCULAR | Status: AC
  Filled 2021-03-04: qty 30

## 2021-03-04 MED ORDER — ACETAMINOPHEN 500 MG PO TABS: 1000.0000 mg | ORAL_TABLET | Freq: Three times a day (TID) | ORAL | 0 refills | Status: AC

## 2021-03-04 MED ORDER — DEXAMETHASONE SODIUM PHOSPHATE 10 MG/ML IJ SOLN
INTRAMUSCULAR | Status: AC
  Filled 2021-03-04: qty 1

## 2021-03-04 MED ORDER — KETAMINE HCL 10 MG/ML IJ SOLN
INTRAMUSCULAR | Status: AC
  Filled 2021-03-04: qty 1

## 2021-03-04 MED ORDER — FENTANYL CITRATE (PF) 250 MCG/5ML IJ SOLN
INTRAMUSCULAR | Status: AC
  Filled 2021-03-04: qty 5

## 2021-03-04 MED ORDER — BUPIVACAINE-EPINEPHRINE (PF) 0.25% -1:200000 IJ SOLN
INTRAMUSCULAR | Status: DC | PRN
  Administered 2021-03-04: 30 mL

## 2021-03-04 MED ORDER — PROPOFOL 10 MG/ML IV BOLUS
INTRAVENOUS | Status: DC | PRN
  Administered 2021-03-04: 200 mg via INTRAVENOUS
  Administered 2021-03-04: 50 mg via INTRAVENOUS

## 2021-03-04 MED ORDER — ONDANSETRON HCL 4 MG/2ML IJ SOLN
INTRAMUSCULAR | Status: DC | PRN
  Administered 2021-03-04: 4 mg via INTRAVENOUS

## 2021-03-04 MED ORDER — FENTANYL CITRATE (PF) 100 MCG/2ML IJ SOLN
INTRAMUSCULAR | Status: DC | PRN
  Administered 2021-03-04: 150 ug via INTRAVENOUS
  Administered 2021-03-04 (×2): 50 ug via INTRAVENOUS

## 2021-03-04 MED ORDER — IBUPROFEN 600 MG PO TABS: 600.0000 mg | ORAL_TABLET | Freq: Three times a day (TID) | ORAL | 0 refills | Status: AC

## 2021-03-04 MED ORDER — MIDAZOLAM HCL 2 MG/2ML IJ SOLN
INTRAMUSCULAR | Status: AC
  Filled 2021-03-04: qty 2

## 2021-03-04 MED ORDER — FENTANYL CITRATE (PF) 100 MCG/2ML IJ SOLN: 25.0000 ug | INTRAMUSCULAR | Status: DC | PRN

## 2021-03-04 MED ORDER — DEXAMETHASONE SODIUM PHOSPHATE 10 MG/ML IJ SOLN
INTRAMUSCULAR | Status: DC | PRN
  Administered 2021-03-04: 10 mg via INTRAVENOUS

## 2021-03-04 MED ORDER — SUCCINYLCHOLINE CHLORIDE 200 MG/10ML IV SOSY
PREFILLED_SYRINGE | INTRAVENOUS | Status: DC | PRN
  Administered 2021-03-04: 200 mg via INTRAVENOUS

## 2021-03-04 MED ORDER — ACETAMINOPHEN 500 MG PO TABS
1000.0000 mg | ORAL_TABLET | ORAL | Status: AC
  Administered 2021-03-04: 1000 mg via ORAL
  Filled 2021-03-04: qty 2

## 2021-03-04 MED ORDER — OXYCODONE HCL 5 MG PO TABS: 5.0000 mg | ORAL_TABLET | Freq: Once | ORAL | Status: DC | PRN

## 2021-03-04 MED ORDER — LIDOCAINE HCL (PF) 2 % IJ SOLN
INTRAMUSCULAR | Status: DC | PRN
  Administered 2021-03-04: 1.5 mg/kg/h via INTRADERMAL

## 2021-03-04 SURGICAL SUPPLY — 58 items
BLADE SURG SZ11 CARB STEEL (BLADE) ×2 IMPLANT
CANNULA REDUC XI 12-8 STAPL (CANNULA)
CANNULA REDUCER 12-8 DVNC XI (CANNULA) IMPLANT
CHLORAPREP W/TINT 26 (MISCELLANEOUS) ×2 IMPLANT
CLSR STERI-STRIP ANTIMIC 1/2X4 (GAUZE/BANDAGES/DRESSINGS) ×2 IMPLANT
COVER SURGICAL LIGHT HANDLE (MISCELLANEOUS) ×2 IMPLANT
COVER TIP SHEARS 8 DVNC (MISCELLANEOUS) ×1 IMPLANT
COVER TIP SHEARS 8MM DA VINCI (MISCELLANEOUS) ×1
COVER WAND RF STERILE (DRAPES) IMPLANT
DECANTER SPIKE VIAL GLASS SM (MISCELLANEOUS) ×2 IMPLANT
DERMABOND ADVANCED (GAUZE/BANDAGES/DRESSINGS) ×1
DERMABOND ADVANCED .7 DNX12 (GAUZE/BANDAGES/DRESSINGS) ×1 IMPLANT
DRAPE ARM DVNC X/XI (DISPOSABLE) ×3 IMPLANT
DRAPE COLUMN DVNC XI (DISPOSABLE) ×1 IMPLANT
DRAPE DA VINCI XI ARM (DISPOSABLE) ×3
DRAPE DA VINCI XI COLUMN (DISPOSABLE) ×1
DRSG TEGADERM 2-3/8X2-3/4 SM (GAUZE/BANDAGES/DRESSINGS) ×6 IMPLANT
ELECT REM PT RETURN 15FT ADLT (MISCELLANEOUS) ×2 IMPLANT
GAUZE SPONGE 2X2 8PLY STRL LF (GAUZE/BANDAGES/DRESSINGS) ×1 IMPLANT
GLOVE BIOGEL PI ORTHO PRO 7.5 (GLOVE) ×2
GLOVE PI ORTHO PRO STRL 7.5 (GLOVE) ×2 IMPLANT
GOWN STRL REUS W/TWL XL LVL3 (GOWN DISPOSABLE) ×4 IMPLANT
GRASPER SUT TROCAR 14GX15 (MISCELLANEOUS) IMPLANT
KIT BASIN OR (CUSTOM PROCEDURE TRAY) ×2 IMPLANT
KIT TURNOVER KIT A (KITS) ×2 IMPLANT
MARKER SKIN DUAL TIP RULER LAB (MISCELLANEOUS) ×2 IMPLANT
MESH 3DMAX 4X6 LT LRG (Mesh General) ×2 IMPLANT
NEEDLE HYPO 22GX1.5 SAFETY (NEEDLE) ×2 IMPLANT
NEEDLE INSUFFLATION 14GA 120MM (NEEDLE) IMPLANT
PACK CARDIOVASCULAR III (CUSTOM PROCEDURE TRAY) ×2 IMPLANT
PAD POSITIONING PINK XL (MISCELLANEOUS) ×2 IMPLANT
POUCH SPECIMEN RETRIEVAL 10MM (ENDOMECHANICALS) ×2 IMPLANT
SCISSORS LAP 5X35 DISP (ENDOMECHANICALS) IMPLANT
SEAL CANN UNIV 5-8 DVNC XI (MISCELLANEOUS) ×3 IMPLANT
SEAL XI 5MM-8MM UNIVERSAL (MISCELLANEOUS) ×3
SET IRRIG TUBING LAPAROSCOPIC (IRRIGATION / IRRIGATOR) IMPLANT
SOL ANTI FOG 6CC (MISCELLANEOUS) ×1 IMPLANT
SOLUTION ANTI FOG 6CC (MISCELLANEOUS) ×1
SOLUTION ELECTROLUBE (MISCELLANEOUS) ×2 IMPLANT
SPONGE GAUZE 2X2 STER 10/PKG (GAUZE/BANDAGES/DRESSINGS) ×1
SPONGE LAP 18X18 RF (DISPOSABLE) ×2 IMPLANT
STAPLER CANNULA SEAL DVNC XI (STAPLE) IMPLANT
STAPLER CANNULA SEAL XI (STAPLE)
STRIP CLOSURE SKIN 1/2X4 (GAUZE/BANDAGES/DRESSINGS) ×2 IMPLANT
SUT MNCRL AB 4-0 PS2 18 (SUTURE) ×2 IMPLANT
SUT VIC AB 2-0 SH 27 (SUTURE) ×2
SUT VIC AB 2-0 SH 27X BRD (SUTURE) ×2 IMPLANT
SUT VICRYL 0 UR6 27IN ABS (SUTURE) ×2 IMPLANT
SUT VLOC 180 2-0 6IN GS21 (SUTURE) IMPLANT
SUT VLOC 180 2-0 9IN GS21 (SUTURE) IMPLANT
SUT VLOC 180 3-0 9IN GS21 (SUTURE) ×2 IMPLANT
SYR 10ML ECCENTRIC (SYRINGE) IMPLANT
SYR 20ML LL LF (SYRINGE) ×2 IMPLANT
TOWEL OR 17X26 10 PK STRL BLUE (TOWEL DISPOSABLE) ×2 IMPLANT
TOWEL OR NON WOVEN STRL DISP B (DISPOSABLE) ×2 IMPLANT
TROCAR BLADELESS OPT 5 100 (ENDOMECHANICALS) IMPLANT
TROCAR XCEL BLUNT TIP 100MML (ENDOMECHANICALS) ×2 IMPLANT
TUBING INSUFFLATION 10FT LAP (TUBING) ×2 IMPLANT

## 2021-03-04 NOTE — Anesthesia Postprocedure Evaluation (Signed)
Anesthesia Post Note  Patient: MAURICE FOTHERINGHAM  Procedure(s) Performed: XI ROBOTIC ASSISTED REPAIR LARGE LEFT INCARCERATED INGUINAL HERNIA REPAIR WITH MESH (Left )     Patient location during evaluation: PACU Anesthesia Type: General Level of consciousness: awake and alert Pain management: pain level controlled Vital Signs Assessment: post-procedure vital signs reviewed and stable Respiratory status: spontaneous breathing, nonlabored ventilation, respiratory function stable and patient connected to nasal cannula oxygen Cardiovascular status: blood pressure returned to baseline and stable Postop Assessment: no apparent nausea or vomiting Anesthetic complications: no   No complications documented.  Last Vitals:  Vitals:   03/04/21 1330 03/04/21 1401  BP: 125/70 (!) 143/86  Pulse: 100 (!) 101  Resp: (!) 23 18  Temp: 36.7 C 36.7 C  SpO2: 93% 93%    Last Pain:  Vitals:   03/04/21 1401  TempSrc: Oral  PainSc:                  March Rummage Capri Raben

## 2021-03-04 NOTE — Anesthesia Procedure Notes (Signed)
Procedure Name: Intubation Date/Time: 03/04/2021 8:34 AM Performed by: Cleda Daub, CRNA Pre-anesthesia Checklist: Patient identified, Emergency Drugs available, Suction available and Patient being monitored Patient Re-evaluated:Patient Re-evaluated prior to induction Oxygen Delivery Method: Circle system utilized Preoxygenation: Pre-oxygenation with 100% oxygen Induction Type: IV induction Ventilation: Mask ventilation without difficulty Laryngoscope Size: Mac and 4 Grade View: Grade I Tube type: Oral Tube size: 7.5 mm Number of attempts: 1 Airway Equipment and Method: Stylet and Oral airway Placement Confirmation: ETT inserted through vocal cords under direct vision,  positive ETCO2 and breath sounds checked- equal and bilateral Secured at: 23 cm Tube secured with: Tape Dental Injury: Teeth and Oropharynx as per pre-operative assessment

## 2021-03-04 NOTE — Discharge Instructions (Signed)
[facility]  POST OP INSTRUCTIONS Always review your discharge instruction sheet given to you by the facility where your surgery was performed. IF YOU HAVE DISABILITY OR FAMILY LEAVE FORMS, YOU MUST BRING THEM TO THE OFFICE FOR PROCESSING.   DO NOT GIVE THEM TO YOUR DOCTOR.  PAIN CONTROL  1. First take acetaminophen (Tylenol) AND/or ibuprofen (Advil) to control your pain after surgery.  Follow directions on package.  Taking acetaminophen (Tylenol) and/or ibuprofen (Advil) regularly after surgery will help to control your pain and lower the amount of prescription pain medication you may need.  You should not take more than 3,000 mg (3 grams) of acetaminophen (Tylenol) in 24 hours.  You should not take ibuprofen (Advil), aleve, motrin, naprosyn or other NSAIDS if you have a history of stomach ulcers or chronic kidney disease.  2. A prescription for pain medication may be given to you upon discharge.  Take your pain medication as prescribed, if you still have uncontrolled pain after taking acetaminophen (Tylenol) or ibuprofen (Advil). 3. Use ice packs to help control pain. 4. If you need a refill on your pain medication, please contact your pharmacy.  They will contact our office to request authorization. Prescriptions will not be filled after 5pm or on week-ends.  HOME MEDICATIONS 5. Take your usually prescribed medications unless otherwise directed.  DIET 6. You should follow a light diet the first few days after arrival home.  Be sure to include lots of fluids daily. Avoid fatty, fried foods.   CONSTIPATION 7. It is common to experience some constipation after surgery and if you are taking pain medication.  Increasing fluid intake and taking a stool softener (such as Colace) will usually help or prevent this problem from occurring.  A mild laxative (Milk of Magnesia or Miralax) should be taken according to package instructions if there are no bowel movements after 48  hours.  WOUND/INCISION CARE 8. Most patients will experience some swelling and bruising in the area of the incisions.  Ice packs will help.  Swelling and bruising can take several days to resolve.  9. Unless discharge instructions indicate otherwise, follow guidelines below  a. STERI-STRIPS - you may remove your outer bandages 48 hours after surgery, and you may shower at that time.  You have steri-strips (small skin tapes) in place directly over the incision.  These strips should be left on the skin for 7-10 days.   b. DERMABOND/SKIN GLUE - you may shower in 24 hours.  The glue will flake off over the next 2-3 weeks. 10. Any sutures or staples will be removed at the office during your follow-up visit.  ACTIVITIES 11. You may resume regular (light) daily activities beginning the next day--such as daily self-care, walking, climbing stairs--gradually increasing activities as tolerated.  You may have sexual intercourse when it is comfortable.  Refrain from any heavy lifting or straining until approved by your doctor. a. You may drive when you are no longer taking prescription pain medication, you can comfortably wear a seatbelt, and you can safely maneuver your car and apply brakes.  FOLLOW-UP 12. You should see your doctor in the office for a follow-up appointment approximately 2-3 weeks after your surgery.  You should have been given your post-op/follow-up appointment when your surgery was scheduled.  If you did not receive a post-op/follow-up appointment, make sure that you call for this appointment within a day or two after you arrive home to insure a convenient appointment time.  OTHER INSTRUCTIONS  13. NO HEAVY LIFTING/PUSHING/PULLING ANYTHING GREATER THAN 10 LBS FOR 1 MONTH  WHEN TO CALL YOUR DOCTOR: 1. Fever over 101.0 2. Inability to urinate 3. Continued bleeding from incision. 4. Increased pain, redness, or drainage from the incision. 5. Increasing abdominal pain  The clinic staff is  available to answer your questions during regular business hours.  Please don't hesitate to call and ask to speak to one of the nurses for clinical concerns.  If you have a medical emergency, go to the nearest emergency room or call 911.  A surgeon from [facility] is always on call at the hospital. [address], [place], [place], [place]  [address] ? [address], [place], [place]   [phone] ? [phone] ? FAX [phone] Web site: [email or web address] ........Marland Kitchen   Managing Your Pain After Surgery Without Opioids    Thank you for participating in our program to help patients manage their pain after surgery without opioids. This is part of our effort to provide you with the best care possible, without exposing you or your family to the risk that opioids pose.  What pain can I expect after surgery? You can expect to have some pain after surgery. This is normal. The pain is typically worse the day after surgery, and quickly begins to get better. Many studies have found that many patients are able to manage their pain after surgery with Over-the-Counter (OTC) medications such as Tylenol and Motrin. If you have a condition that does not allow you to take Tylenol or Motrin, notify your surgical team.  How will I manage my pain? The best strategy for controlling your pain after surgery is around the clock pain control with Tylenol (acetaminophen) and Motrin (ibuprofen or Advil). Alternating these medications with each other allows you to maximize your pain control. In addition to Tylenol and Motrin, you can use heating pads or ice packs on your incisions to help reduce your pain.  How will I alternate your regular strength over-the-counter pain medication? You will take a dose of pain medication every three hours. ; Start by taking 650 mg of Tylenol (2 pills of 325 mg) ; 3 hours later take 600 mg of Motrin (3 pills of 200 mg) ; 3 hours after taking  the Motrin take 650 mg of Tylenol ; 3 hours after that take 600 mg of Motrin.   - 1 -  See example - if your first dose of Tylenol is at 12:00 PM   12:00 PM Tylenol 650 mg (2 pills of 325 mg)  3:00 PM Motrin 600 mg (3 pills of 200 mg)  6:00 PM Tylenol 650 mg (2 pills of 325 mg)  9:00 PM Motrin 600 mg (3 pills of 200 mg)  Continue alternating every 3 hours   We recommend that you follow this schedule around-the-clock for at least 3 days after surgery, or until you feel that it is no longer needed. Use the table on the last page of this handout to keep track of the medications you are taking. Important: Do not take more than 3000mg  of Tylenol or 1800mg  of Motrin in a 24-hour period. Do not take ibuprofen/Motrin if you have a history of bleeding stomach ulcers, severe kidney disease, &/or actively taking a blood thinner  What if I still have pain? If you have pain that is not controlled with the over-the-counter pain medications (Tylenol and Motrin or Advil) you might have what we call "breakthrough" pain. You will receive a  prescription for a small amount of an opioid pain medication such as Oxycodone, Tramadol, or Tylenol with Codeine. Use these opioid pills in the first 24 hours after surgery if you have breakthrough pain. Do not take more than 1 pill every 4-6 hours.  If you still have uncontrolled pain after using all opioid pills, don't hesitate to call our staff using the number provided. We will help make sure you are managing your pain in the best way possible, and if necessary, we can provide a prescription for additional pain medication.   Day 1    Time  Name of Medication Number of pills taken  Amount of Acetaminophen  Pain Level   Comments  AM PM       AM PM       AM PM       AM PM       AM PM       AM PM       AM PM       AM PM       Total Daily amount of Acetaminophen Do not take more than  3,000 mg per day      Day 2    Time  Name of Medication Number of  pills taken  Amount of Acetaminophen  Pain Level   Comments  AM PM       AM PM       AM PM       AM PM       AM PM       AM PM       AM PM       AM PM       Total Daily amount of Acetaminophen Do not take more than  3,000 mg per day      Day 3    Time  Name of Medication Number of pills taken  Amount of Acetaminophen  Pain Level   Comments  AM PM       AM PM       AM PM       AM PM          AM PM       AM PM       AM PM       AM PM       Total Daily amount of Acetaminophen Do not take more than  3,000 mg per day      Day 4    Time  Name of Medication Number of pills taken  Amount of Acetaminophen  Pain Level   Comments  AM PM       AM PM       AM PM       AM PM       AM PM       AM PM       AM PM       AM PM       Total Daily amount of Acetaminophen Do not take more than  3,000 mg per day      Day 5    Time  Name of Medication Number of pills taken  Amount of Acetaminophen  Pain Level   Comments  AM PM       AM PM       AM PM       AM PM       AM PM  AM PM       AM PM       AM PM       Total Daily amount of Acetaminophen Do not take more than  3,000 mg per day       Day 6    Time  Name of Medication Number of pills taken  Amount of Acetaminophen  Pain Level  Comments  AM PM       AM PM       AM PM       AM PM       AM PM       AM PM       AM PM       AM PM       Total Daily amount of Acetaminophen Do not take more than  3,000 mg per day      Day 7    Time  Name of Medication Number of pills taken  Amount of Acetaminophen  Pain Level   Comments  AM PM       AM PM       AM PM       AM PM       AM PM       AM PM       AM PM       AM PM       Total Daily amount of Acetaminophen Do not take more than  3,000 mg per day        For additional information about how and where to safely dispose of unused opioid medications - [employer]  Disclaimer: This document contains  information and/or instructional materials adapted from [place] for the typical patient with your condition. It does not replace medical advice from your health care provider because your experience may differ from that of the typical patient. Talk to your health care provider if you have any questions about this document, your condition or your treatment plan. Adapted from [place]

## 2021-03-04 NOTE — H&P (Signed)
CC: here for surgery  Requesting provider: Dr Orland Mustard  HPI: Brian Hodges is an 35 y.o. male who is here for repair of large Left incarcerated inguinal hernia. Seen in clinic in Feb. Denies any changes since seen. No f/c/d/c/cp/sob. Continues to smoke.   The patient is a 35 year old male who presents with an inguinal hernia. He is referred by Dr [name] for evaluation of left inguinal hernia. The patient states that a few months ago he awoke or noticed swelling in his left testicle. He didn't know what had happened. It was just a large bulge in his left scrotum. He saw his primary care doctor and underwent a scrotal ultrasound. There was concern as it could be a large hydrocele. His ultrasound showed no evidence of hydrocele or varicocele and normal testicles. He had left inguinal hernia containing fat and likely bowel. He states occasionally it will cause discomfort. He denies any neuropathic pain. He denies any trouble urinating. He denies any diarrhea or constipation. He states that these had some skin issues in his pubic area in the past. He appears have a small fat-containing umbilical hernia as well.  He does smoke 1 pack per day. He also smokes marijuana occasionally.  He also states that he has had some erectile dysfunction since noticing the bulge. He has tried Viagra without any improvement  Past Medical History:  Diagnosis Date  . GERD (gastroesophageal reflux disease)     History reviewed. No pertinent surgical history.  History reviewed. No pertinent family history.  Social:  reports that he has been smoking cigarettes. He has a 7.50 pack-year smoking history. He has never used smokeless tobacco. He reports current drug use. Frequency: 7.00 times per week. Drug: Marijuana. He reports that he does not drink alcohol.  Allergies: No Known Allergies  Medications: I have reviewed the patient's current medications.   ROS - all of the below systems have  been reviewed with the patient and positives are indicated with bold text General: chills, fever or night sweats Eyes: blurry vision or double vision ENT: epistaxis or sore throat Allergy/Immunology: itchy/watery eyes or nasal congestion Hematologic/Lymphatic: bleeding problems, blood clots or swollen lymph nodes Endocrine: temperature intolerance or unexpected weight changes Breast: new or changing breast lumps or nipple discharge Resp: cough, shortness of breath, or wheezing CV: chest pain or dyspnea on exertion GI: as per HPI GU: dysuria, trouble voiding, or hematuria MSK: joint pain or joint stiffness Neuro: TIA or stroke symptoms Derm: pruritus and skin lesion changes Psych: anxiety and depression  PE Blood pressure 134/81, pulse 97, temperature 98 F (36.7 C), temperature source Oral, resp. rate 18, height 5\' 11"  (1.803 m), weight 129.3 kg, SpO2 99 %. Constitutional: NAD; conversant; no deformities Eyes: Moist conjunctiva; no lid lag; anicteric; PERRL Neck: Trachea midline; no thyromegaly Lungs: Normal respiratory effort; no tactile fremitus CV: RRR; no palpable thrills; no pitting edema GI: Abd obese, soft; no palpable hepatosplenomegaly, NT GU: chronically incarcerated L inguinal hernia; chronic skin changes to pubic area - old healed sores MSK: Normal gait; no clubbing/cyanosis Psychiatric: Appropriate affect; alert and oriented x3 Lymphatic: No palpable cervical or axillary lymphadenopathy Skin:see above  No results found for this or any previous visit (from the past 48 hour(s)).  No results found.  Imaging: reviewed  A/P: Brian Hodges is an 35 y.o. male with  Obesity tob use Chronically incarcerated left inguinal hernia Umbilical hernia  To OR for robotic assisted repair of L incarcerated inguinal hernia with  mesh IV abx Eras Void on call to OR All questions asked and answered  Rationale for MIS approach - concerned about wound infection if do an  open approach given his chronic skin sores in pubic area, obesity and tobacco use  Leighton Ruff. Redmond Pulling, MD, FACS General, Bariatric, & Minimally Invasive Surgery [facility], [place]

## 2021-03-04 NOTE — Transfer of Care (Signed)
Immediate Anesthesia Transfer of Care Note  Patient: Brian Hodges  Procedure(s) Performed: XI ROBOTIC ASSISTED REPAIR LARGE LEFT INCARCERATED INGUINAL HERNIA REPAIR WITH MESH (Left )  Patient Location: PACU  Anesthesia Type:General  Level of Consciousness: drowsy and patient cooperative  Airway & Oxygen Therapy: Patient Spontanous Breathing and Patient connected to face mask oxygen  Post-op Assessment: Report given to RN and Post -op Vital signs reviewed and stable  Post vital signs: Reviewed and stable  Last Vitals:  Vitals Value Taken Time  BP 142/82 03/04/21 1223  Temp    Pulse 101 03/04/21 1228  Resp 31 03/04/21 1228  SpO2 98 % 03/04/21 1228  Vitals shown include unvalidated device data.  Last Pain:  Vitals:   03/04/21 0652  TempSrc:   PainSc: 0-No pain      Patients Stated Pain Goal: 4 (12/75/17 0017)  Complications: No complications documented.

## 2021-03-04 NOTE — Op Note (Signed)
PREOPERATIVE DIAGNOSIS: large chronically incarcerated left inguinal hernia.    POSTOPERATIVE DIAGNOSIS: large chronically incarcerated  Left indirect inguinal hernia   PROCEDURE: Robotic/XI repair of large chronically incarcerated  Left indirect inguinal hernia with  mesh (rTAPP).    SURGEON: Leighton Ruff. Redmond Pulling, MD    ASSISTANT SURGEON: None.    ANESTHESIA: General plus local consisting of 0.25% Marcaine with epi.    ESTIMATED BLOOD LOSS: Minimal.    FINDINGS: The patient had a large incarcerated left indirect inguinal hernia containing sigmoid colon and omentum.  It was repaired using Bard 3dmax left large mesh    SPECIMEN: large cord lipoma   INDICATIONS FOR PROCEDURE: 35yo presented for repair of a symptomatic inguinal hernia. The risks and benefits including but not limited to bleeding, infection, chronic inguinal pain, nerve entrapment, hernia recurrence, mesh complications, hematoma formation, urinary retention, injury to the testicles or the ovaries, numbness in the groin, blood clots, injury to the surrounding structures, and anesthesia risk was discussed with the patient.  Typically I would approach this inguinal hernia via an open incision but the patient has chronic sores in his inguinal area which I was concerned would significantly increase his risk for wound infection and possible mesh infection should we do an open approach therefore an MIS approach was preferred even though the hernia was chronically incarcerated   DESCRIPTION OF PROCEDURE: After obtaining verbal consent the patient was then taken back to the operating room, placed  supine on the operating room table. General endotracheal anesthesia was  established. The patient had tried to empty their bladder prior to going back to  the operating room. Sequential compression devices were placed.  An in and out catheterization was done and 120 cc of urine was evacuated from the bladder.  The  abdomen and groin were prepped and  draped in the usual standard surgical  fashion with ChloraPrep. The patient received oral Tylenol/gabapentin preoperatively as well as IV  antibiotics prior to the incision. A surgical time-out was performed.    Optiview entry was performed in the left upper quadrant about 2 fingerbreadths below the left subcostal margin in the midclavicular line using the robotic Optiview camera through a 5 mm trocar.  The abdominal cavity was entered.  Pneumoperitoneum was established.  There was no evidence of injury to surrounding structures.  A 8 mm robotic trocar was placed in the supraumbilical position approximately 20 cm from the pubic bone.  A 12 mm trocar was placed in the right midabdomen along with a 8 mm trocar in the lateral right abdominal wall all under direct visualization.  A Exparel Marcaine tap block was performed on the right.  I then exchanged the optical view 5 mm trocar with the robotic 8 mm trocar in the left upper quadrant.  A tap block was performed on the left side.  We then placed our suture and the abdominal cavity in the left lower quadrant consisting of two 3-0 Vicryl sutures along with a 3 OV lock suture.  Also placed a piece of Bard large left 3D max mesh in the left groin as well. Local was infiltrated at the base of the umbilicus.      There was no evidence of injury to surrounding structures.  Patient had a chronically incarcerated left indirect inguinal hernia containing sigmoid colon and omentum      We then deployed the robot for pelvic surgery from patient's left.  The robot was docked.  Robotic laparoscope was placed through the supraumbilical trocar  and the anatomy was targeted.  The other arms were then connected to the trochars.  A pair of MCS scissors was placed through the right trocar and a fenestrated bipolar through the left trocar all under direct visualization.  A probe grasp grasper was placed in the right most lateral robotic trocar. I then scrubbed out and went to  the robotic console.  Using the fenestrated bipolar and the prograsp grasper I was able to ultimately reduce the incarcerated omentum and sigmoid colon.  This took about 10 minutes.  There was no evidence of injury to the colon or the omentum    I then made incision along the peritoneum on the left, starting 2 inches above the anterior superior iliac spine and caring it medial toward the median umbilical ligament in a lazy S configuration using MCS scissors with electrocautery. The peritoneal flap was then gently dissected downward from the anterior abdominal wall taking care not to  injure the inferior epigastric vessels. The pubic bone was identified. The testicular vessels were identified.  Using traction and counter traction, I reduced the sac in  its entirety however this took approximately about 1.5 hrs. the patient had a very large indirect hernia sac.  It was tethered to the testicle.  The patient also had a very large cord lipoma extending into the retroperitoneum as a retroperitoneal lipoma.  I was able to ultimately separate the cord lipoma and retroperitoneal lipoma from the hernia sac and cord structures using a combination of bipolar and scissors with and without energy.  I did use a surgical Ray-Tec at times to help sweep the surrounding tissue off of the large hernia sac.  The testicular vessels had been identified and preserved. The vas deferens was identified and preserved, and the hernia sac was stripped from those to  surrounding structures.    I then went about creating a large pocket by lifting the peritoneum of the pelvic floor. I took great care not to injure the iliac vessels.      I then obtained the previously placed piece of Bard large 3D left max mesh for the left groin and placed it into the inguinal area.   half of it covered medial  to the inferior epigastric vessels and half of it lateral to the  inferior epigastric vessels. The defect was well  covered with the mesh.  I  then secured the mesh to Cooper's ligament/pubis with an interrupted 3-0 Vicryl suture.  I placed an additional suture superior medially along the edge of the mesh medial to the inferior gastric vessel.  I placed an additional interrupted 3-0 Vicryl suture along the medial edge of the mesh. I placed a 3 Vicryl suture laterally along the superior lateral edge of the mesh lateral to the inferior epigastric vessels.  I then closed the peritoneal flap with a running 3-0 Vicryl V-Loc.  The mesh was well covered.  There is no defect in the peritoneal flap.  The mesh was flat.  It had not curled up.  Both testicles were confirmed to be in the scrotum.    The surgical robot was undocked and moved away from the OR table.  I scrubbed back in.  The surgical Ray-Tec was removed. We removed the 3 sutures and using a specimen bag removed the cord lipoma x2.     I removed the 12 mm trocar and then closed the fascial defect with an interrupted 0 Vicryl using a PMI suture passer.  The closure was viewed laparoscopically.  There was no evidence of fascial defect. There was no air leak at the umbilicus. There was no  evidence of injury to surrounding structures. Pneumoperitoneum was released, and the remaining trocars were removed. All skin incisions  were closed with a 4-0 Monocryl in a subcuticular fashion followed by application of  Dermabond. All needle, instrument, and sponge counts  were correct x2.   There are no immediate complications. The patient tolerated the procedure well. The patient was extubated and taken to the  recovery room in stable condition.  Leighton Ruff. Redmond Pulling, MD, FACS General, Bariatric, & Minimally Invasive Surgery [facility], [place]

## 2021-03-05 ENCOUNTER — Encounter (HOSPITAL_COMMUNITY): Payer: Self-pay | Admitting: General Surgery

## 2021-07-02 ENCOUNTER — Emergency Department (HOSPITAL_BASED_OUTPATIENT_CLINIC_OR_DEPARTMENT_OTHER)
Admission: EM | Admit: 2021-07-02 | Discharge: 2021-07-02 | Disposition: A | Discharge status: Home / Self Care | Payer: Self-pay | Attending: Emergency Medicine | Admitting: Emergency Medicine

## 2021-07-02 ENCOUNTER — Other Ambulatory Visit: Payer: Self-pay

## 2021-07-02 ENCOUNTER — Encounter (HOSPITAL_BASED_OUTPATIENT_CLINIC_OR_DEPARTMENT_OTHER): Payer: Self-pay | Admitting: Emergency Medicine

## 2021-07-02 ENCOUNTER — Emergency Department (HOSPITAL_BASED_OUTPATIENT_CLINIC_OR_DEPARTMENT_OTHER): Payer: Self-pay

## 2021-07-02 DIAGNOSIS — J039 Acute tonsillitis, unspecified: Secondary | ICD-10-CM

## 2021-07-02 DIAGNOSIS — F17210 Nicotine dependence, cigarettes, uncomplicated: Secondary | ICD-10-CM | POA: Insufficient documentation

## 2021-07-02 DIAGNOSIS — Z20822 Contact with and (suspected) exposure to covid-19: Secondary | ICD-10-CM | POA: Insufficient documentation

## 2021-07-02 DIAGNOSIS — R000 Tachycardia, unspecified: Secondary | ICD-10-CM | POA: Insufficient documentation

## 2021-07-02 DIAGNOSIS — J029 Acute pharyngitis, unspecified: Secondary | ICD-10-CM | POA: Insufficient documentation

## 2021-07-02 LAB — CBC WITH DIFFERENTIAL/PLATELET
Abs Immature Granulocytes: 0.08 10*3/uL — ABNORMAL HIGH (ref 0.00–0.07)
Basophils Absolute: 0.1 10*3/uL (ref 0.0–0.1)
Basophils Relative: 0 %
Eosinophils Absolute: 0.1 10*3/uL (ref 0.0–0.5)
Eosinophils Relative: 0 %
HCT: 39.6 % (ref 39.0–52.0)
Hemoglobin: 13 g/dL (ref 13.0–17.0)
Immature Granulocytes: 0 %
Lymphocytes Relative: 8 %
Lymphs Abs: 1.7 10*3/uL (ref 0.7–4.0)
MCH: 28.3 pg (ref 26.0–34.0)
MCHC: 32.8 g/dL (ref 30.0–36.0)
MCV: 86.1 fL (ref 80.0–100.0)
Monocytes Absolute: 1.4 10*3/uL — ABNORMAL HIGH (ref 0.1–1.0)
Monocytes Relative: 7 %
Neutro Abs: 17.1 10*3/uL — ABNORMAL HIGH (ref 1.7–7.7)
Neutrophils Relative %: 85 %
Platelets: 275 10*3/uL (ref 150–400)
RBC: 4.6 MIL/uL (ref 4.22–5.81)
RDW: 13.4 % (ref 11.5–15.5)
WBC: 20.5 10*3/uL — ABNORMAL HIGH (ref 4.0–10.5)
nRBC: 0 % (ref 0.0–0.2)

## 2021-07-02 LAB — BASIC METABOLIC PANEL
Anion gap: 9 (ref 5–15)
BUN: 11 mg/dL (ref 6–20)
CO2: 27 mmol/L (ref 22–32)
Calcium: 9 mg/dL (ref 8.9–10.3)
Chloride: 97 mmol/L — ABNORMAL LOW (ref 98–111)
Creatinine, Ser: 0.77 mg/dL (ref 0.61–1.24)
GFR, Estimated: >60 mL/min (ref >60)
Glucose, Bld: 118 mg/dL — ABNORMAL HIGH (ref 70–99)
Potassium: 3.8 mmol/L (ref 3.5–5.1)
Sodium: 133 mmol/L — ABNORMAL LOW (ref 135–145)

## 2021-07-02 LAB — GROUP A STREP BY PCR: Group A Strep by PCR: NOT DETECTED

## 2021-07-02 LAB — RESP PANEL BY RT-PCR (FLU A&B, COVID) ARPGX2
Influenza A by PCR: NEGATIVE
Influenza B by PCR: NEGATIVE
SARS Coronavirus 2 by RT PCR: NEGATIVE

## 2021-07-02 MED ORDER — CLINDAMYCIN HCL 150 MG PO CAPS: 450.0000 mg | ORAL_CAPSULE | Freq: Three times a day (TID) | ORAL | 0 refills | Status: AC

## 2021-07-02 MED ORDER — CLINDAMYCIN PHOSPHATE 600 MG/50ML IV SOLN
600.0000 mg | Freq: Once | INTRAVENOUS | Status: AC
  Administered 2021-07-02: 600 mg via INTRAVENOUS
  Filled 2021-07-02: qty 50

## 2021-07-02 MED ORDER — IOHEXOL 350 MG/ML SOLN
60.0000 mg | Freq: Once | INTRAVENOUS | Status: AC | PRN
  Administered 2021-07-02: 60 mg via INTRAVENOUS

## 2021-07-02 MED ORDER — PREDNISONE 10 MG PO TABS: 40.0000 mg | ORAL_TABLET | Freq: Every day | ORAL | 0 refills | Status: AC

## 2021-07-02 MED ORDER — IBUPROFEN 800 MG PO TABS
800.0000 mg | ORAL_TABLET | Freq: Once | ORAL | Status: AC
  Administered 2021-07-02: 800 mg via ORAL
  Filled 2021-07-02: qty 1

## 2021-07-02 MED ORDER — DEXAMETHASONE SODIUM PHOSPHATE 10 MG/ML IJ SOLN
10.0000 mg | Freq: Once | INTRAMUSCULAR | Status: AC
  Administered 2021-07-02: 10 mg via INTRAVENOUS
  Filled 2021-07-02: qty 1

## 2021-07-02 NOTE — Discharge Instructions (Addendum)
You were seen at [facility] emergency room for a sore throat and trouble swallowing. We did blood work and imaging of your neck and chest. You tested negative for strep throat but do show signs of having an infection caused by tonsillitis. Your imaging did not show any signs of an abscess or anything that we need to drain. We will prescribe you antibiotics and steroids to take to help clear up the infection and reduce the swelling. You should call and make a follow up appointment with the ear nose and throat doctor. Come back to the emergency room if you have trouble breathing, increased trouble swallowing, or feel short of breath.

## 2021-07-02 NOTE — ED Triage Notes (Signed)
Reports sore throat with difficulty swallowing since yesterday.  Also reports runny nose with thick mucous.

## 2021-07-02 NOTE — ED Notes (Signed)
Patient c/o sore throat, difficulty swallowing "feels like my throat is closing".  On examination patient has swollen tonsils, small amount of exudate noted, patient is febrile.  Patient is coughing up thick, green phlegm into emesis bag.  Patient does not appear to have trouble managing his secretions.

## 2021-07-02 NOTE — ED Provider Notes (Signed)
[place] EMERGENCY DEPT Provider Note   CSN: [id number] Arrival date & time: 07/02/21  [id number]     History Chief Complaint  Patient presents with   Sore Throat    Brian Hodges is a 35 y.o. male.   Sore Throat Pertinent negatives include no shortness of breath.  Patient is an otherwise healthy 35 year old male coming in with trouble swallowing and feeling like his throat is closing up. This all started yesterday morning, prior to which he was feeling totally normal. His only symptoms are the sore throat. Denies any trouble breathing, cough, or congestion. He has never had anything like this happen before. Has not been able to eat or drink anything since this started because of the trouble swallowing. Does endorse trouble swallowing his spit because it feels thick.     Past Medical History:  Diagnosis Date   GERD (gastroesophageal reflux disease)     Patient Active Problem List   Diagnosis Date Noted   LACERATION OF FINGER 01/09/2010    Past Surgical History:  Procedure Laterality Date   XI ROBOTIC ASSISTED INGUINAL HERNIA REPAIR WITH MESH Left 03/04/2021   Procedure: XI ROBOTIC ASSISTED REPAIR LARGE LEFT INCARCERATED INGUINAL HERNIA REPAIR WITH MESH;  Surgeon: Greer Pickerel, MD;  Location: [facility] ORS;  Service: General;  Laterality: Left;  2.5 HOURS       No family history on file.  Social History   Tobacco Use   Smoking status: Every Day    Packs/day: 0.50    Years: 15.00    Pack years: 7.50    Types: Cigarettes   Smokeless tobacco: Never  Vaping Use   Vaping Use: Never used  Substance Use Topics   Alcohol use: Never   Drug use: Yes    Frequency: 7.0 times per week    Types: Marijuana    Home Medications Prior to Admission medications   Medication Sig Start Date End Date Taking? Authorizing Provider  oxyCODONE (OXY IR/ROXICODONE) 5 MG immediate release tablet Take 1 tablet (5 mg total) by mouth every 6 (six) hours as needed for severe pain.  03/04/21   Greer Pickerel, MD    Allergies    Patient has no known allergies.  Review of Systems   Review of Systems  Constitutional:  Positive for appetite change.  HENT:  Positive for sore throat and trouble swallowing. Negative for dental problem.   Respiratory:  Negative for cough and shortness of breath.   All other systems reviewed and are negative.  Physical Exam Updated Vital Signs BP 128/86 (BP Location: Right Arm)   Pulse (!) 104   Temp (!) 101.5 F (38.6 C) (Oral)   Resp 18   Ht '5\' 10"'$  (1.778 m)   Wt 117.9 kg   SpO2 97%   BMI 37.31 kg/m   Physical Exam HENT:     Head: Normocephalic.     Mouth/Throat:     Pharynx: Uvula midline. Pharyngeal swelling, posterior oropharyngeal erythema and uvula swelling present.  Eyes:     Conjunctiva/sclera: Conjunctivae normal.  Cardiovascular:     Rate and Rhythm: Tachycardia present.  Pulmonary:     Effort: Pulmonary effort is normal.  Abdominal:     Palpations: Abdomen is soft.  Musculoskeletal:     Cervical back: Normal range of motion.  Neurological:     Mental Status: He is alert.    ED Results / Procedures / Treatments   Labs (all labs ordered are listed, but only abnormal results are  displayed) Labs Reviewed  RESP PANEL BY RT-PCR (FLU A&B, COVID) ARPGX2  GROUP A STREP BY PCR  CBC WITH DIFFERENTIAL/PLATELET  BASIC METABOLIC PANEL    EKG None  Radiology No results found.  Procedures Procedures   Medications Ordered in ED Medications  ibuprofen (ADVIL) tablet 800 mg (has no administration in time range)    ED Course  I have reviewed the triage vital signs and the nursing notes.  Pertinent labs & imaging results that were available during my care of the patient were reviewed by me and considered in my medical decision making (see chart for details).    MDM Rules/Calculators/A&P                           Patient appears with signs of throat infection, elevated WBC to 20, strep test negative.  Patient mentating and phonating well, able to swallow pills. Denies any shortness of breath, no stridor on exam. Patient given 10 mg of decadron IV as well as motrin due to fever. Low suspicion for sexually transmitted tonsillar infection at this time given patient has had no new partners and infrequently sexually active. BMP unremarkable. CXR unremarkable, CT did show Tonsillar swelling results in near complete effacement of the oropharyngeal airway and nasopharynx. Spoke with ENT to discuss need for further management or closer evaluation, since patient is maintaining his airway, does not have any trouble breathing, stridor, wheezing, and is able to swallow they believe that he is clinically stable to go home with steroids and antibiotics and follow up as an outpatient. Will discharge patient and refer to ENT for follow up.  Final Clinical Impression(s) / ED Diagnoses Final diagnoses:  None    Rx / DC Orders ED Discharge Orders     None        Scarlett Presto, MD 07/02/21 1129    Lucrezia Starch, MD 07/03/21 1023

## 2021-12-31 IMAGING — CT CT NECK W/ CM
4 of 5 series · 13 of 33 positions shown, 15 images · IV contrast (APPLIED)
Comparison: None.

CLINICAL DATA: Sore throat, difficulty swallowing

EXAM:
CT NECK WITH CONTRAST
TECHNIQUE: Multidetector CT imaging of the neck was performed using the
standard protocol following the bolus administration of intravenous
contrast.
CONTRAST:  60mg OMNIPAQUE IOHEXOL 350 MG/ML SOLN

[Series 3: ax bone · axial · 0.72mm/px · z∈[-527,-419]mm · 3 of 108 slices shown, 4 images]
[im 27/108  soft-tissue]
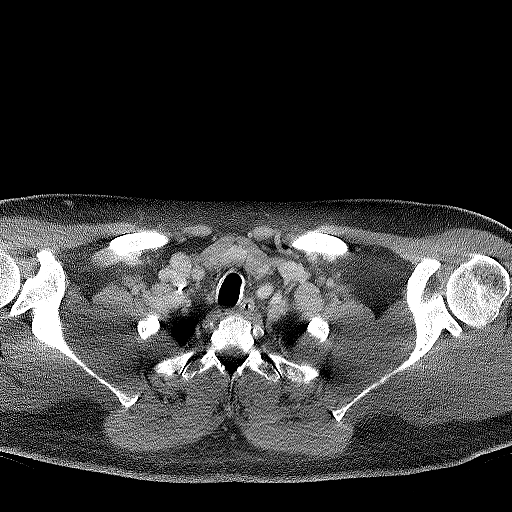
[im 27/108  bone]
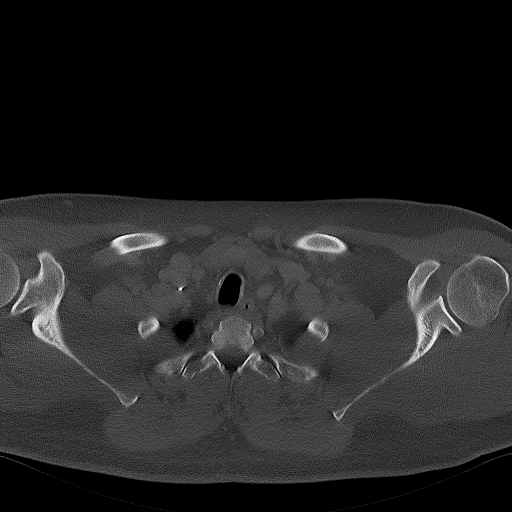
[im 54/108  bone]
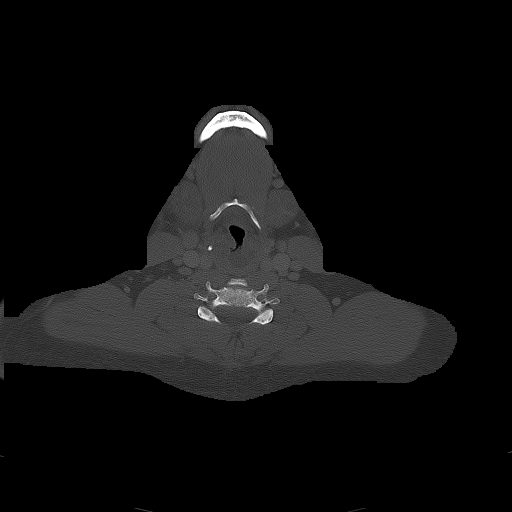
[im 81/108  bone]
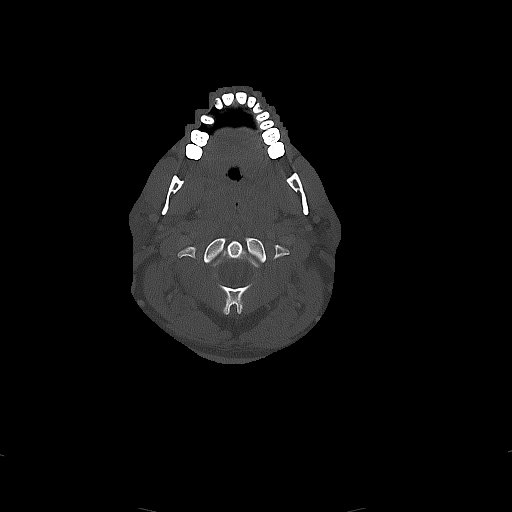

[Series 4: cor neck · coronal · 0.44mm/px · 3 of 148 slices shown]
[im 30/148  bone]
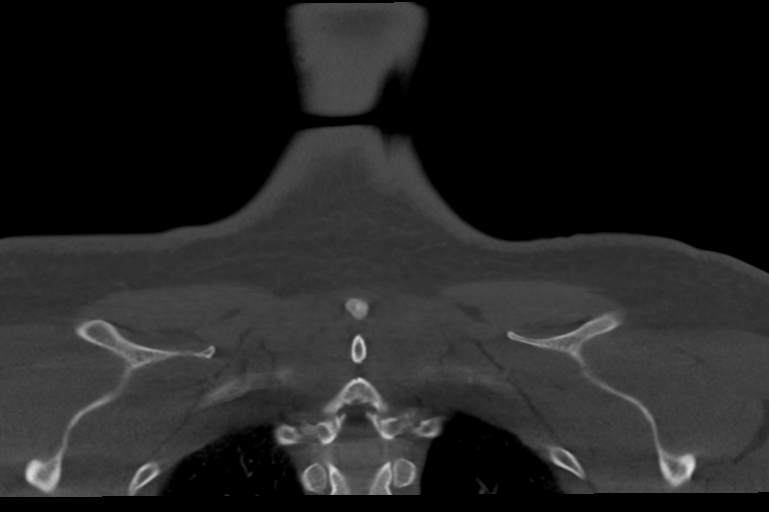
[im 59/148  bone]
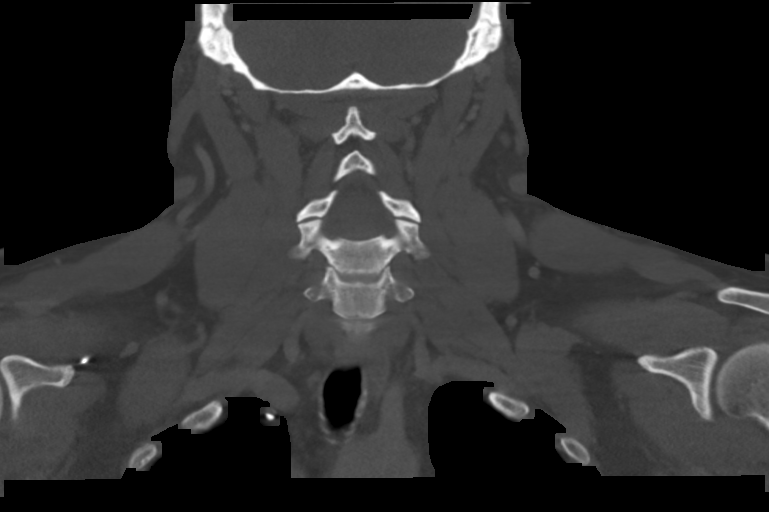
[im 89/148  bone]
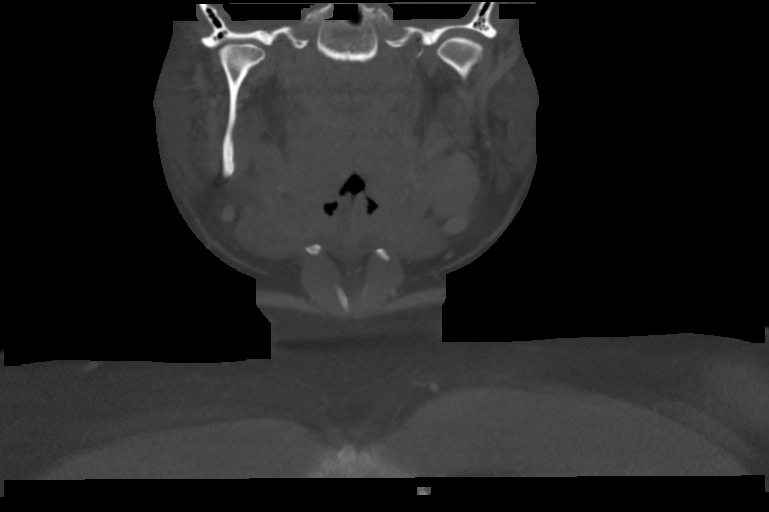

[Series 5: sag neck · sagittal · 0.44mm/px · 5 of 144 slices shown, 6 images]
[im 48/144  bone]
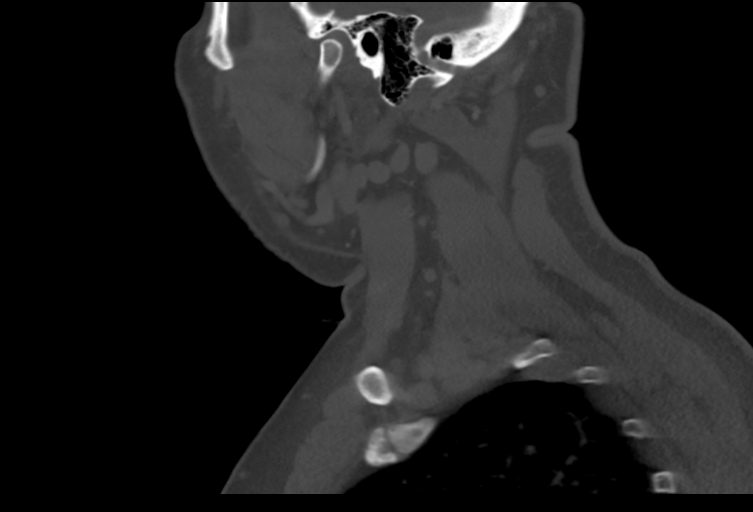
[im 60/144  bone]
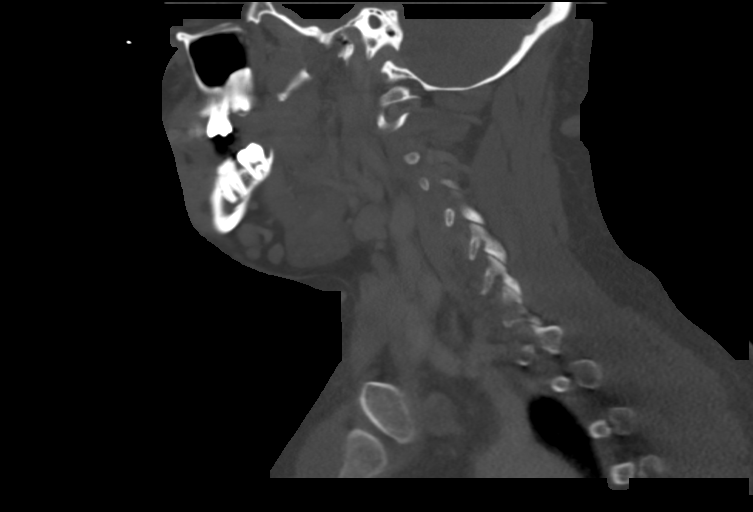
[im 72/144  soft-tissue]
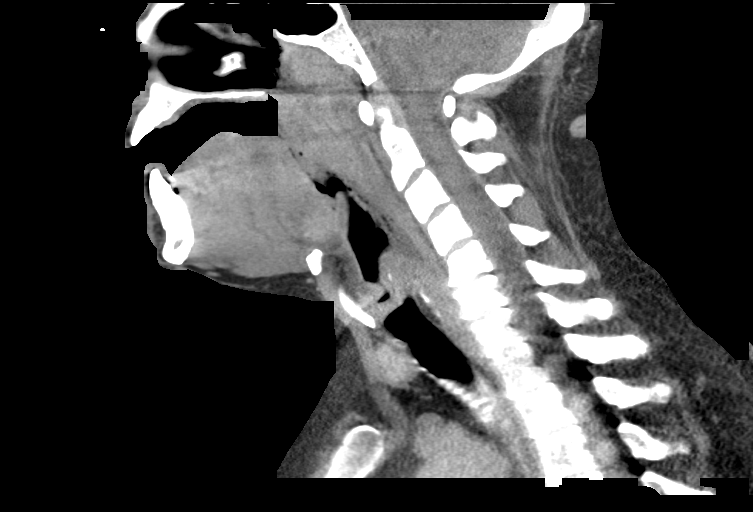
[im 72/144  bone]
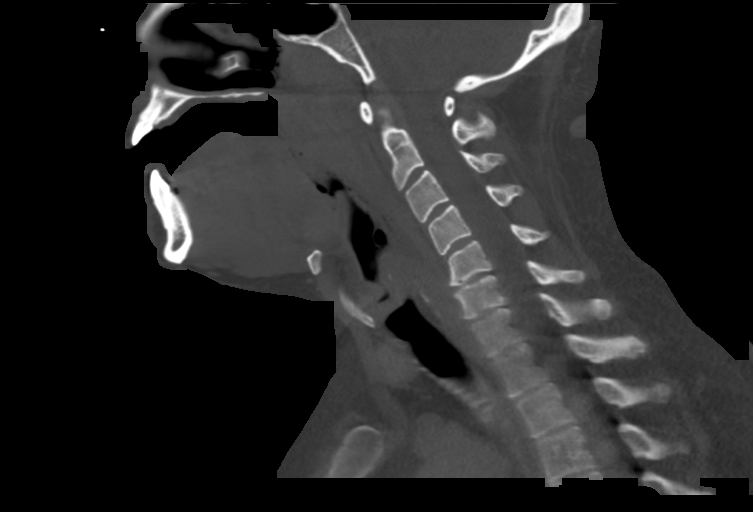
[im 84/144  bone]
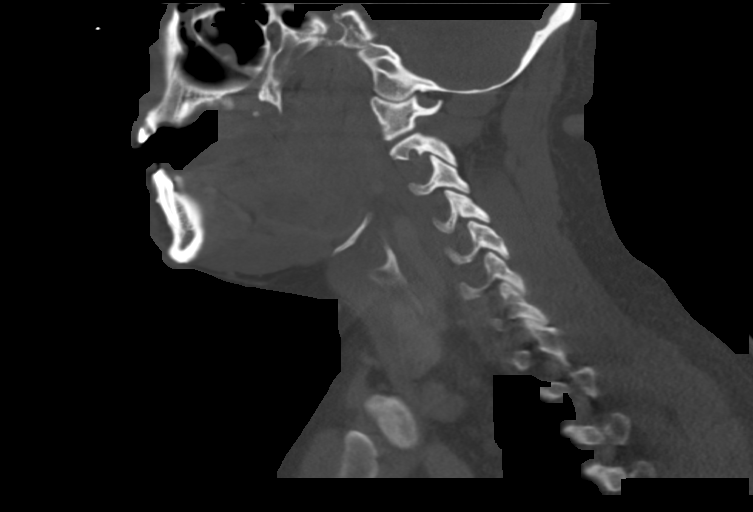
[im 96/144  bone]
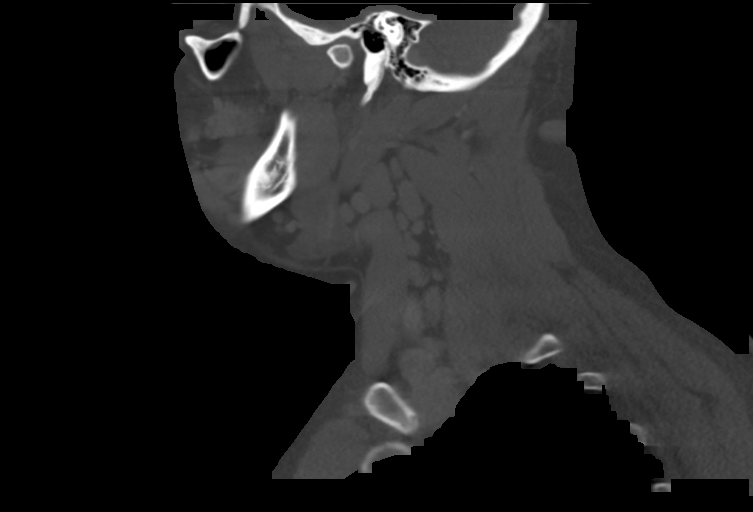

[Series 6: ax oropharynx · axial · 0.61mm/px · z∈[-573,-519]mm · 2 of 112 slices shown]
[im 28/112  bone]
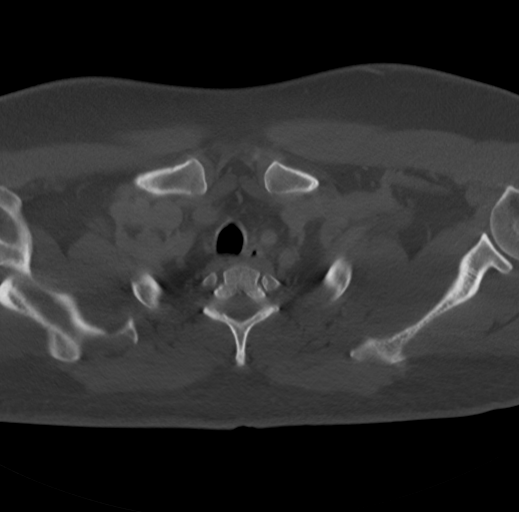
[im 56/112  bone]
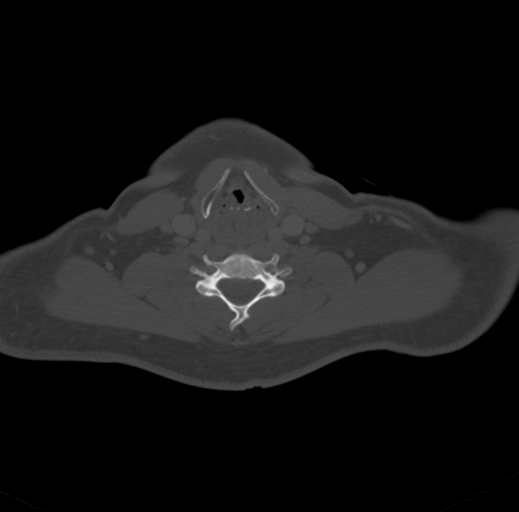

[13 of 33 positions shown; findings below may reference images not displayed]

FINDINGS: Pharynx and larynx: The adenoid and palatine tonsils are enlarged
and heterogeneously enhancing. There is near-complete effacement of
the oropharyngeal airway at the level of the palatine tonsils. The
lingual tonsils are also prominent. The pharyngeal mucosa appears
swollen. There is no organized or drainable fluid collection
identified. There is layering debris/secretions in the hypopharynx.
The larynx is unremarkable. The epiglottis is normal.

Salivary glands: The parotid and submandibular glands are
unremarkable.

Thyroid: Unremarkable.

Lymph nodes: There are multiple prominent cervical chain lymph nodes
measuring up to 1.6 cm on the left at level IIA and 1.7 cm on the
right at level III.

Vascular: The vasculature is unremarkable.

Limited intracranial: The imaged portions of the intracranial
compartment are unremarkable.

Visualized orbits: The imaged globes and orbits are unremarkable.

Mastoids and visualized paranasal sinuses: There is mild mucosal
thickening in the maxillary sinuses.

Skeleton: There is no acute osseous abnormality or aggressive
osseous lesion.

Upper chest: The lung apices are clear.

Other:

There is periapical lucency around the left maxillary canine.
Multiple dental caries are noted.
IMPRESSION: 1. Enlarged and heterogeneously enhancing adenoid and palatine
tonsils and swollen pharyngeal mucosa consistent with tonsillitis
and pharyngitis with reactive lymphadenopathy. There is no organized
or drainable fluid collection to suggest tonsillar abscess.
Tonsillar swelling results in near complete effacement of the
oropharyngeal airway and nasopharynx.
2. Periapical lucency around the left maxillary canine and multiple
dental caries.

## 2023-12-15 ENCOUNTER — Ambulatory Visit: Payer: Self-pay | Admitting: General Surgery

## 2023-12-27 ENCOUNTER — Encounter (HOSPITAL_BASED_OUTPATIENT_CLINIC_OR_DEPARTMENT_OTHER): Payer: Self-pay | Admitting: General Surgery

## 2023-12-27 ENCOUNTER — Other Ambulatory Visit: Payer: Self-pay

## 2023-12-29 NOTE — Progress Notes (Signed)

## 2024-01-01 NOTE — Anesthesia Preprocedure Evaluation (Signed)
 Anesthesia Evaluation  Patient identified by MRN, date of birth, ID band Patient awake    Reviewed: Allergy & Precautions, NPO status , Patient's Chart, lab work & pertinent test results  Airway Mallampati: II  TM Distance: >3 FB Neck ROM: Full    Dental no notable dental hx. (+) Dental Advisory Given, Teeth Intact   Pulmonary neg recent URI, Current Smoker and Patient abstained from smoking.   Pulmonary exam normal breath sounds clear to auscultation       Cardiovascular negative cardio ROS Normal cardiovascular exam Rhythm:Regular Rate:Normal     Neuro/Psych negative neurological ROS  negative psych ROS   GI/Hepatic Neg liver ROS,GERD  ,,Incarcerated inguinal hernia   Endo/Other  negative endocrine ROS    Renal/GU      Musculoskeletal negative musculoskeletal ROS (+)    Abdominal  (+) + obese  Peds  Hematology negative hematology ROS (+)   Anesthesia Other Findings   Reproductive/Obstetrics                             Anesthesia Physical Anesthesia Plan  ASA: 2  Anesthesia Plan: General   Post-op Pain Management: Tylenol PO (pre-op)* and Gabapentin PO (pre-op)*   Induction: Intravenous  PONV Risk Score and Plan: 3 and Ondansetron, Dexamethasone, Midazolam and Treatment may vary due to age or medical condition  Airway Management Planned: Oral ETT  Additional Equipment: None  Intra-op Plan:   Post-operative Plan: Extubation in OR  Informed Consent: I have reviewed the patients History and Physical, chart, labs and discussed the procedure including the risks, benefits and alternatives for the proposed anesthesia with the patient or authorized representative who has indicated his/her understanding and acceptance.     Dental advisory given  Plan Discussed with: CRNA  Anesthesia Plan Comments: (Risks of anesthesia explained at length. This includes, but is not limited to,  sore throat, damage to teeth, lips gums, tongue and vocal cords, nausea and vomiting, reactions to medications, stroke, heart attack, and death. All patient questions were answered and the patient wishes to proceed. Risks of peripheral nerve block explained at length. This includes, but is not limited to, bleeding, infection, reactions to the medications, seizures, damage to surrounding structures, damage to nerves, permanent weakness, numbness, tingling and pain. All patient questions were answered and patient wishes to proceed with nerve block. )        Anesthesia Quick Evaluation

## 2024-01-02 ENCOUNTER — Encounter (HOSPITAL_BASED_OUTPATIENT_CLINIC_OR_DEPARTMENT_OTHER): Payer: Self-pay | Admitting: General Surgery

## 2024-01-02 ENCOUNTER — Other Ambulatory Visit: Payer: Self-pay

## 2024-01-02 ENCOUNTER — Ambulatory Visit (HOSPITAL_BASED_OUTPATIENT_CLINIC_OR_DEPARTMENT_OTHER): Payer: Self-pay | Admitting: Certified Registered"

## 2024-01-02 ENCOUNTER — Encounter (HOSPITAL_BASED_OUTPATIENT_CLINIC_OR_DEPARTMENT_OTHER)
Admission: RE | Disposition: A | Discharge status: Home / Self Care | Payer: Self-pay | Source: Home / Self Care | Attending: General Surgery

## 2024-01-02 ENCOUNTER — Ambulatory Visit (HOSPITAL_BASED_OUTPATIENT_CLINIC_OR_DEPARTMENT_OTHER)
Admission: RE | Admit: 2024-01-02 | Discharge: 2024-01-02 | Disposition: A | Discharge status: Home / Self Care | Payer: Managed Care, Other (non HMO) | Attending: General Surgery | Admitting: General Surgery

## 2024-01-02 DIAGNOSIS — F17210 Nicotine dependence, cigarettes, uncomplicated: Secondary | ICD-10-CM | POA: Diagnosis not present

## 2024-01-02 DIAGNOSIS — K4091 Unilateral inguinal hernia, without obstruction or gangrene, recurrent: Secondary | ICD-10-CM | POA: Diagnosis present

## 2024-01-02 DIAGNOSIS — S31104A Unspecified open wound of abdominal wall, left lower quadrant without penetration into peritoneal cavity, initial encounter: Secondary | ICD-10-CM

## 2024-01-02 DIAGNOSIS — Z538 Procedure and treatment not carried out for other reasons: Secondary | ICD-10-CM | POA: Diagnosis not present

## 2024-01-02 DIAGNOSIS — Z5309 Procedure and treatment not carried out because of other contraindication: Secondary | ICD-10-CM | POA: Insufficient documentation

## 2024-01-02 DIAGNOSIS — K429 Umbilical hernia without obstruction or gangrene: Secondary | ICD-10-CM | POA: Insufficient documentation

## 2024-01-02 DIAGNOSIS — L732 Hidradenitis suppurativa: Secondary | ICD-10-CM | POA: Insufficient documentation

## 2024-01-02 SURGERY — CANCELLED PROCEDURE
Anesthesia: General

## 2024-01-02 MED ORDER — SUGAMMADEX SODIUM 200 MG/2ML IV SOLN
INTRAVENOUS | Status: DC | PRN
Start: 2024-01-02 — End: 2024-01-02
  Administered 2024-01-02: 200 mg via INTRAVENOUS

## 2024-01-02 MED ORDER — ONDANSETRON HCL 4 MG/2ML IJ SOLN
INTRAMUSCULAR | Status: AC
  Filled 2024-01-02: qty 2

## 2024-01-02 MED ORDER — FENTANYL CITRATE (PF) 100 MCG/2ML IJ SOLN
INTRAMUSCULAR | Status: AC
  Filled 2024-01-02: qty 2

## 2024-01-02 MED ORDER — CHLORHEXIDINE GLUCONATE CLOTH 2 % EX PADS: 6.0000 | MEDICATED_PAD | Freq: Once | CUTANEOUS | Status: DC

## 2024-01-02 MED ORDER — CEFAZOLIN SODIUM-DEXTROSE 2-4 GM/100ML-% IV SOLN
INTRAVENOUS | Status: AC
  Filled 2024-01-02: qty 100

## 2024-01-02 MED ORDER — ACETAMINOPHEN 325 MG PO TABS: 650.0000 mg | ORAL_TABLET | Freq: Four times a day (QID) | ORAL | 0 refills | Status: AC

## 2024-01-02 MED ORDER — HYDROMORPHONE HCL 1 MG/ML IJ SOLN: 0.2500 mg | INTRAMUSCULAR | Status: DC | PRN

## 2024-01-02 MED ORDER — OXYCODONE HCL 5 MG PO TABS: 5.0000 mg | ORAL_TABLET | Freq: Three times a day (TID) | ORAL | 0 refills | Status: AC | PRN

## 2024-01-02 MED ORDER — DOXYCYCLINE HYCLATE 100 MG PO TABS: 100.0000 mg | ORAL_TABLET | Freq: Two times a day (BID) | ORAL | 0 refills | Status: AC

## 2024-01-02 MED ORDER — PROPOFOL 10 MG/ML IV BOLUS
INTRAVENOUS | Status: AC
  Filled 2024-01-02: qty 20

## 2024-01-02 MED ORDER — LIDOCAINE 2% (20 MG/ML) 5 ML SYRINGE
INTRAMUSCULAR | Status: AC
Start: 2024-01-02 — End: ?
  Filled 2024-01-02: qty 5

## 2024-01-02 MED ORDER — LACTATED RINGERS IV SOLN: INTRAVENOUS | Status: DC

## 2024-01-02 MED ORDER — FAMOTIDINE IN NACL 20-0.9 MG/50ML-% IV SOLN: 20.0000 mg | Freq: Once | INTRAVENOUS | Status: DC

## 2024-01-02 MED ORDER — BUPIVACAINE LIPOSOME 1.3 % IJ SUSP: 20.0000 mL | Freq: Once | INTRAMUSCULAR | Status: DC

## 2024-01-02 MED ORDER — FENTANYL CITRATE (PF) 100 MCG/2ML IJ SOLN
50.0000 ug | Freq: Once | INTRAMUSCULAR | Status: AC
  Administered 2024-01-02: 50 ug via INTRAVENOUS

## 2024-01-02 MED ORDER — DROPERIDOL 2.5 MG/ML IJ SOLN: 0.6250 mg | Freq: Once | INTRAMUSCULAR | Status: DC | PRN

## 2024-01-02 MED ORDER — PROPOFOL 10 MG/ML IV BOLUS
INTRAVENOUS | Status: DC | PRN
  Administered 2024-01-02: 200 mg via INTRAVENOUS

## 2024-01-02 MED ORDER — CLONIDINE HCL (ANALGESIA) 100 MCG/ML EP SOLN
EPIDURAL | Status: DC | PRN
Start: 2024-01-02 — End: 2024-01-02
  Administered 2024-01-02: 80 ug

## 2024-01-02 MED ORDER — MIDAZOLAM HCL 2 MG/2ML IJ SOLN
2.0000 mg | Freq: Once | INTRAMUSCULAR | Status: AC
  Administered 2024-01-02: 2 mg via INTRAVENOUS

## 2024-01-02 MED ORDER — MIDAZOLAM HCL 2 MG/2ML IJ SOLN
INTRAMUSCULAR | Status: AC
Start: 2024-01-02 — End: ?
  Filled 2024-01-02: qty 2

## 2024-01-02 MED ORDER — ONDANSETRON HCL 4 MG/2ML IJ SOLN
INTRAMUSCULAR | Status: DC | PRN
Start: 2024-01-02 — End: 2024-01-02
  Administered 2024-01-02: 4 mg via INTRAVENOUS

## 2024-01-02 MED ORDER — DEXAMETHASONE SODIUM PHOSPHATE 4 MG/ML IJ SOLN
INTRAMUSCULAR | Status: DC | PRN
  Administered 2024-01-02: 5 mg

## 2024-01-02 MED ORDER — ROCURONIUM BROMIDE 100 MG/10ML IV SOLN
INTRAVENOUS | Status: DC | PRN
  Administered 2024-01-02: 80 mg via INTRAVENOUS

## 2024-01-02 MED ORDER — CEFAZOLIN SODIUM-DEXTROSE 2-4 GM/100ML-% IV SOLN
2.0000 g | INTRAVENOUS | Status: AC
  Administered 2024-01-02: 2 g via INTRAVENOUS

## 2024-01-02 MED ORDER — ROPIVACAINE HCL 5 MG/ML IJ SOLN
INTRAMUSCULAR | Status: DC | PRN
  Administered 2024-01-02: 30 mL via PERINEURAL

## 2024-01-02 MED ORDER — GABAPENTIN 300 MG PO CAPS
ORAL_CAPSULE | ORAL | Status: AC
  Filled 2024-01-02: qty 1

## 2024-01-02 MED ORDER — ACETAMINOPHEN 500 MG PO TABS
1000.0000 mg | ORAL_TABLET | ORAL | Status: AC
  Administered 2024-01-02: 1000 mg via ORAL

## 2024-01-02 MED ORDER — MIDAZOLAM HCL 2 MG/2ML IJ SOLN
INTRAMUSCULAR | Status: AC
  Filled 2024-01-02: qty 2

## 2024-01-02 MED ORDER — ACETAMINOPHEN 500 MG PO TABS
ORAL_TABLET | ORAL | Status: AC
  Filled 2024-01-02: qty 2

## 2024-01-02 MED ORDER — IBUPROFEN 200 MG PO TABS: 600.0000 mg | ORAL_TABLET | Freq: Four times a day (QID) | ORAL | 0 refills | Status: AC

## 2024-01-02 MED ORDER — GABAPENTIN 300 MG PO CAPS
300.0000 mg | ORAL_CAPSULE | ORAL | Status: AC
Start: 2024-01-02 — End: 2024-01-02
  Administered 2024-01-02: 300 mg via ORAL

## 2024-01-02 SURGICAL SUPPLY — 45 items
BLADE CLIPPER SURG (BLADE) IMPLANT
BLADE SURG 15 STRL LF DISP TIS (BLADE) ×1 IMPLANT
CANISTER SUCT 1200ML W/VALVE (MISCELLANEOUS) IMPLANT
CHLORAPREP W/TINT 26 (MISCELLANEOUS) ×1 IMPLANT
COVER BACK TABLE 60X90IN (DRAPES) ×1 IMPLANT
COVER MAYO STAND STRL (DRAPES) ×1 IMPLANT
DERMABOND ADVANCED .7 DNX12 (GAUZE/BANDAGES/DRESSINGS) ×1 IMPLANT
DRAIN PENROSE .5X12 LATEX STL (DRAIN) ×1 IMPLANT
DRAPE LAPAROTOMY 100X72 PEDS (DRAPES) ×1 IMPLANT
DRAPE LAPAROTOMY TRNSV 102X78 (DRAPES) ×1 IMPLANT
DRAPE UTILITY XL STRL (DRAPES) ×1 IMPLANT
ELECT COATED BLADE 2.86 ST (ELECTRODE) ×1 IMPLANT
ELECT REM PT RETURN 9FT ADLT (ELECTROSURGICAL) IMPLANT
ELECTRODE REM PT RTRN 9FT ADLT (ELECTROSURGICAL) ×1 IMPLANT
GAUZE 4X4 16PLY ~~LOC~~+RFID DBL (SPONGE) IMPLANT
GAUZE SPONGE 4X4 12PLY STRL LF (GAUZE/BANDAGES/DRESSINGS) IMPLANT
GLOVE BIO SURGEON STRL SZ7 (GLOVE) ×1 IMPLANT
GLOVE BIOGEL PI IND STRL 7.5 (GLOVE) ×1 IMPLANT
GOWN STRL REUS W/ TWL LRG LVL3 (GOWN DISPOSABLE) ×2 IMPLANT
GOWN STRL REUS W/ TWL XL LVL3 (GOWN DISPOSABLE) ×1 IMPLANT
NDL HYPO 25X1 1.5 SAFETY (NEEDLE) ×1 IMPLANT
NEEDLE HYPO 25X1 1.5 SAFETY (NEEDLE) IMPLANT
NS IRRIG 1000ML POUR BTL (IV SOLUTION) IMPLANT
PACK BASIN DAY SURGERY FS (CUSTOM PROCEDURE TRAY) ×1 IMPLANT
PENCIL SMOKE EVACUATOR (MISCELLANEOUS) ×1 IMPLANT
SLEEVE SCD COMPRESS KNEE MED (STOCKING) ×1 IMPLANT
SPIKE FLUID TRANSFER (MISCELLANEOUS) IMPLANT
SPONGE T-LAP 4X18 ~~LOC~~+RFID (SPONGE) ×1 IMPLANT
STRIP CLOSURE SKIN 1/2X4 (GAUZE/BANDAGES/DRESSINGS) IMPLANT
SUT MNCRL AB 4-0 PS2 18 (SUTURE) ×1 IMPLANT
SUT MON AB 4-0 PC3 18 (SUTURE) IMPLANT
SUT NOVA 0 T19/GS 22DT (SUTURE) IMPLANT
SUT PDS AB 0 CT1 27 (SUTURE) ×1 IMPLANT
SUT PDS AB 2-0 CT2 27 (SUTURE) IMPLANT
SUT PROLENE 2 0 SH DA (SUTURE) ×2 IMPLANT
SUT SILK 3 0 SH 30 (SUTURE) IMPLANT
SUT VIC AB 2-0 SH 27XBRD (SUTURE) IMPLANT
SUT VIC AB 3-0 54X BRD REEL (SUTURE) IMPLANT
SUT VIC AB 3-0 SH 27X BRD (SUTURE) ×1 IMPLANT
SUT VICRYL 3-0 CR8 SH (SUTURE) ×1 IMPLANT
SUT VICRYL AB 2 0 TIE (SUTURE) IMPLANT
SYR CONTROL 10ML LL (SYRINGE) ×1 IMPLANT
TOWEL GREEN STERILE FF (TOWEL DISPOSABLE) ×1 IMPLANT
TUBE CONNECTING 20X1/4 (TUBING) IMPLANT
YANKAUER SUCT BULB TIP NO VENT (SUCTIONS) IMPLANT

## 2024-01-02 NOTE — Progress Notes (Signed)
 Assisted Dr. Renold Don with left, transabdominal plane, ultrasound guided block. Side rails up, monitors on throughout procedure. See vital signs in flow sheet. Tolerated Procedure well.

## 2024-01-02 NOTE — H&P (Signed)
     Brian Hodges 1986/05/16  [id number].    HPI:  38 y/o M who underwent a robotic left inguinal hernia repair in 2022.  At the time of surgery he also had an umbilical hernia repaired primarily.  Unfortunately, he has developed recurrence of both hernias. He presents today for elective repair and reports that he is in his usual state of health.  He denies any recent changes in medication.   ROS: Review of Systems  Constitutional: Negative.   HENT: Negative.    Eyes: Negative.   Respiratory: Negative.    Cardiovascular: Negative.   Gastrointestinal: Negative.   Genitourinary: Negative.   Musculoskeletal: Negative.   Skin: Negative.   Neurological: Negative.   Endo/Heme/Allergies: Negative.   Psychiatric/Behavioral: Negative.      History reviewed. No pertinent family history.  Past Medical History:  Diagnosis Date   GERD (gastroesophageal reflux disease)     Past Surgical History:  Procedure Laterality Date   XI ROBOTIC ASSISTED INGUINAL HERNIA REPAIR WITH MESH Left 03/04/2021   Procedure: XI ROBOTIC ASSISTED REPAIR LARGE LEFT INCARCERATED INGUINAL HERNIA REPAIR WITH MESH;  Surgeon: Gaynelle Adu, MD;  Location: [facility]S;  Service: General;  Laterality: Left;  2.5 HOURS    Social History:  reports that he has been smoking cigarettes. He has a 7.5 pack-year smoking history. He has never used smokeless tobacco. He reports current drug use. Frequency: 7.00 times per week. Drug: Marijuana. He reports that he does not drink alcohol.  Allergies: No Known Allergies  No medications prior to admission.    Physical Exam: Height 5\' 10"  (1.778 m), weight 117.9 kg. Gen: male, NAD Abd: soft, non-distended, umbilical bulge marked Groin: Left inguinal hernia  No results found for this or any previous visit (from the past 48 hours). No results found.  Assessment/Plan 38 y/o M who presents for elective umbilical and left inguinal hernia repairs  - Will proceed to the OR. We  discussed the alternatives and potential risks of surgery, including but not limited to: bleeding, infection, damage to bowel or surrounding structures, mesh complications, chronic pain, recurrent hernia, and need for additional procedures. All questions were addressed and consent was obtained.    [facility] 01/02/2024, 7:25 AM Please see Amion for pager number during day hours 7:00am-4:30pm or 7:00am -11:30am on weekends

## 2024-01-02 NOTE — Anesthesia Postprocedure Evaluation (Signed)
 Anesthesia Post Note  Patient: Brian Hodges  Procedure(s) Performed: CANCELLED PROCEDURE     Patient location during evaluation: PACU Anesthesia Type: General Level of consciousness: sedated and patient cooperative Pain management: pain level controlled Vital Signs Assessment: post-procedure vital signs reviewed and stable Respiratory status: spontaneous breathing Cardiovascular status: stable Anesthetic complications: no   No notable events documented.  Last Vitals:  Vitals:   01/02/24 1100 01/02/24 1115  BP:  117/74  Pulse: 79 74  Resp: 12 14  Temp:  36.7 C  SpO2: 91% 96%    Last Pain:  Vitals:   01/02/24 1115  PainSc: 0-No pain                 Lewie Loron

## 2024-01-02 NOTE — Discharge Instructions (Addendum)
 Home Care Instructions After Anesthesia  Activity  The effects of anesthesia are still present and drowsiness may result.  Limit activity for the first 24 hours, then you may return to normal daily activities.  Do not drive or operate heavy machinery within 24 hours of taking narcotic pain medications.   NO Tylenol before 1:30pm today.   Post Anesthesia Home Care Instructions  Activity: Get plenty of rest for the remainder of the day. A responsible individual must stay with you for 24 hours following the procedure.  For the next 24 hours, DO NOT: -Drive a car -Advertising copywriter -Drink alcoholic beverages -Take any medication unless instructed by your physician -Make any legal decisions or sign important papers.  Meals: Start with liquid foods such as gelatin or soup. Progress to regular foods as tolerated. Avoid greasy, spicy, heavy foods. If nausea and/or vomiting occur, drink only clear liquids until the nausea and/or vomiting subsides. Call your physician if vomiting continues.  Special Instructions/Symptoms: Your throat may feel dry or sore from the anesthesia or the breathing tube placed in your throat during surgery. If this causes discomfort, gargle with warm salt water. The discomfort should disappear within 24 hours.  If you had a scopolamine patch placed behind your ear for the management of post- operative nausea and/or vomiting:  1. The medication in the patch is effective for 72 hours, after which it should be removed.  Wrap patch in a tissue and discard in the trash. Wash hands thoroughly with soap and water. 2. You may remove the patch earlier than 72 hours if you experience unpleasant side effects which may include dry mouth, dizziness or visual disturbances. 3. Avoid touching the patch. Wash your hands with soap and water after contact with the patch.   Regional Anesthesia Blocks  1. You may not be able to move or feel the "blocked" extremity after a regional  anesthetic block. This may last may last from 3-48 hours after placement, but it will go away. The length of time depends on the medication injected and your individual response to the medication. As the nerves start to wake up, you may experience tingling as the movement and feeling returns to your extremity. If the numbness and inability to move your extremity has not gone away after 48 hours, please call your surgeon.   2. The extremity that is blocked will need to be protected until the numbness is gone and the strength has returned. Because you cannot feel it, you will need to take extra care to avoid injury. Because it may be weak, you may have difficulty moving it or using it. You may not know what position it is in without looking at it while the block is in effect.  3. For blocks in the legs and feet, returning to weight bearing and walking needs to be done carefully. You will need to wait until the numbness is entirely gone and the strength has returned. You should be able to move your leg and foot normally before you try and bear weight or walk. You will need someone to be with you when you first try to ensure you do not fall and possibly risk injury.  4. Bruising and tenderness at the needle site are common side effects and will resolve in a few days.  5. Persistent numbness or new problems with movement should be communicated to the surgeon or the [facility] [phone] [facility] [phone]).

## 2024-01-02 NOTE — Anesthesia Procedure Notes (Signed)
 Anesthesia Regional Block: TAP block   Pre-Anesthetic Checklist: , timeout performed,  Correct Patient, Correct Site, Correct Laterality,  Correct Procedure, Correct Position, site marked,  Risks and benefits discussed,  Surgical consent,  Pre-op evaluation,  At surgeon's request and post-op pain management  Laterality: Left  Prep: chloraprep       Needles:  Injection technique: Single-shot  Needle Type: Echogenic Stimulator Needle      Needle Gauge: 21     Additional Needles:   Procedures:,,,, ultrasound used (permanent image in chart),,    Narrative:  Start time: 01/02/2024 7:36 AM End time: 01/02/2024 7:56 AM Injection made incrementally with aspirations every 5 mL.  Performed by: Personally  Anesthesiologist: Lewie Loron, MD  Additional Notes: BP cuff, SpO2 and EKG monitors applied. Sedation begun.  Anesthetic injected incrementally, slowly, and after neg aspirations under direct ultrasound guidance. Good fascial spread noted. Patient tolerated well.

## 2024-01-02 NOTE — Progress Notes (Signed)
 I arrived to the OR after the patient had been intubated.  On examination of the operative fields, I noted what appeared to be folliculitis without evidence of an abscess or worsening infection along his abdominal wall.  In his left groin crease there was several open wounds with signs of hidradenitis. Given the plan for mesh placement and the concern for active infection, I decided that it would be best to cancel his operation.  I called his mother to inform her of my decision.  I will refer him to dermatology and plan to see him back in a few months to evaluate for signs of ongoing infection.   Melody Haver, MD   General Surgeon [facility], [place]

## 2024-01-02 NOTE — Transfer of Care (Signed)
 Immediate Anesthesia Transfer of Care Note  Patient: Brian Hodges  Procedure(s) Performed: CANCELLED PROCEDURE  Patient Location: PACU  Anesthesia Type:General and Regional  Level of Consciousness: awake and patient cooperative  Airway & Oxygen Therapy: Patient Spontanous Breathing and Patient connected to face mask oxygen  Post-op Assessment: Report given to RN and Post -op Vital signs reviewed and stable  Post vital signs: Reviewed and stable  Last Vitals:  Vitals Value Taken Time  BP 127/85 01/02/24 1016  Temp 36.7 C 01/02/24 1015  Pulse 79 01/02/24 1023  Resp 23 01/02/24 1023  SpO2 96 % 01/02/24 1023  Vitals shown include unfiled device data.  Last Pain:  Vitals:   01/02/24 0724  PainSc: 0-No pain      Patients Stated Pain Goal: 7 (01/02/24 0724)  Complications: No notable events documented.

## 2024-03-06 ENCOUNTER — Encounter: Payer: Self-pay | Admitting: Dermatology

## 2024-03-06 ENCOUNTER — Ambulatory Visit: Admitting: Dermatology

## 2024-03-06 VITALS — BP 126/81 | HR 96

## 2024-03-06 DIAGNOSIS — L732 Hidradenitis suppurativa: Secondary | ICD-10-CM | POA: Diagnosis not present

## 2024-03-06 DIAGNOSIS — Z139 Encounter for screening, unspecified: Secondary | ICD-10-CM

## 2024-03-06 DIAGNOSIS — L723 Sebaceous cyst: Secondary | ICD-10-CM

## 2024-03-06 MED ORDER — TRIAMCINOLONE ACETONIDE 40 MG/ML IJ SUSP
40.0000 mg | Freq: Once | INTRAMUSCULAR | Status: AC
Start: 2024-03-06 — End: 2024-03-06
  Administered 2024-03-06: 40 mg via INTRAMUSCULAR

## 2024-03-06 MED ORDER — CLINDAMYCIN PHOSPHATE 1 % EX LOTN: TOPICAL_LOTION | Freq: Every day | CUTANEOUS | 5 refills | Status: DC

## 2024-03-06 NOTE — Progress Notes (Signed)
 New Patient Visit   Subjective  Brian Hodges is a 38 y.o. male who presents for the following: Non-healing Wound in Right thigh & left Groin  Patient states he  has wound located at the Right thing & Left groin that he  would like to have examined. Patient reports the areas have been there for several years. He reports the areas are bothersome if he tries to squeeze. Patient rates irritation 7 out of 10. He states that the areas have not spread. Patient reports he has previously been treated for these areas. Patient reports he was prescribed Doxycyline 1 month ago but he felt he had a reaction (itching) so he stopped. He was the  prescribed Bactrim that he is currently still taking. Patient denies Hx of bx. Patient reports family history of similar skin conditions. Mom recently had a cystic nodule removed.  The patient has spots, moles and lesions to be evaluated, some may be new or changing and the patient may have concern these could be cancer.  The following portions of the chart were reviewed this encounter and updated as appropriate: medications, allergies, medical history  Review of Systems:  No other skin or systemic complaints except as noted in HPI or Assessment and Plan.  Objective  Well appearing patient in no apparent distress; mood and affect are within normal limits.  A focused examination was performed of the following areas: Right Thigh and Left Groin  Relevant exam findings are noted in the Assessment and Plan.    Assessment & Plan   HIDRADENITIS SUPPURATIVA Exam: Vallerie Gave Stage 3: multiple interconnected sinus tracts and abscesses throughout and entire area  Flared  Patient Education Discussed During Visit: Hidradenitis Suppurativa is a chronic; persistent; non-curable, but treatable condition due to abnormal inflamed sweat glands in the body folds (axilla, inframammary, groin, medial thighs), causing recurrent painful draining cysts and scarring. It can be  associated with severe scarring acne and cysts; also abscesses and scarring of scalp. The goal is control and prevention of flares, as it is not curable. Scars are permanent and can be thickened. Treatment may include daily use of topical medication and oral antibiotics.  Oral isotretinoin may also be helpful.  For some cases, Humira or Cosentyx (biologic injections) may be prescribed to decrease the inflammatory process and prevent flares.  When indicated, inflamed cysts may also be treated surgically.  - Assessment: Patient presents with a history of recurrent cysts in the left groin and right thigh, consistent with hidradenitis suppurativa (HS). Symptoms present since age 89, with frequent flares lasting a couple of days. Family history significant, with mother having similar cysts and father having a condition suggestive of HS. Recent inguinal hernia repair canceled due to active lesions. Chronic nature, location of lesions, and family history strongly support HS diagnosis. Flare triggers identified include hormonal changes, stress, and diet, particularly sugary foods.   - Plan:    Initiate topical regimen:     - Benzoyl peroxide wash, to be used with antibacterial soap (e.g., Dial) for 30-60 seconds     - Clindamycin  lotion, apply daily    Prescribe Bactrim for acute flares, to be taken for 1 week    Administer Kenalog (triamcinolone) injections to active lesions:     - Right thigh     - Left inguinal crease     - Right inguinal area     - Left thigh    Recommend Epsom salt baths for symptom relief    Order laboratory  tests:     - Screen for tuberculosis     - Screen for hepatitis    Discuss potential long-term management options:     - Cosentyx (secukinumab)     - Humira (adalimumab)    Patient education:     - Avoid squeezing lesions     - Identify and manage flare triggers (stress, diet)    Follow-up appointment in 2-3 months  SCREENING DUE   Related Procedures QuantiFERON-TB  Gold Plus Acute Hep Panel & Hep B Surface Ab HIDRADENITIS SUPPURATIVA   Related Medications clindamycin  (CLEOCIN -T) 1 % lotion Apply topically daily. Apply daily to the effected areas INFLAMED SEBACEOUS CYST (4) Left Genitocrural Fold, Left Medial Thigh, Right Buttock, Right Medial Thigh Procedure Note Intralesional Injection  Location: Right Thigh, Left Thigh, Left Inguinal Crease  Informed Consent: Discussed risks (infection, pain, bleeding, bruising, thinning of the skin, loss of skin pigment, lack of resolution, and recurrence of lesion) and benefits of the procedure, as well as the alternatives. Informed consent was obtained. Preparation: The area was prepared a standard fashion.  Anesthesia: None  Procedure Details: An intralesional injection was performed with Kenalog 40 mg/cc. 0.5 cc in total were injected. NDC #: 4098-1191-47 Exp: 04/06/2025  Total number of injections: 6  Plan: The patient was instructed on post-op care. Recommend OTC analgesia as needed for pain.  Related Medications triamcinolone acetonide (KENALOG-40) injection 40 mg   Return in about 3 months (around 06/05/2024) for HS F/U.  I, Jetta Ager, am acting as Neurosurgeon for [employer], DO.   Documentation: I have reviewed the above documentation for accuracy and completeness, and I agree with the above.  Louana Roup, DO

## 2024-03-06 NOTE — Patient Instructions (Addendum)
 Hello Brian Hodges,  Thank you for visiting today. Here is a summary of the key instructions:  Diagnosis: Hidradenitis Suppurativa   - Medications:   - Use CeraVe benzoyl peroxide 10% and Dial soap daily on affected areas   - Keep in contact with skin for 30 to 60 seconds   - Apply clindamycin  lotion daily to affected areas   - Take Bactrim for 1 week if you get a flare-up  - Lifestyle Changes:   - Avoid squeezing cysts   - Take Epsom salt baths to help calm flare-ups   - Reduce intake of sugary foods  - Lab Tests:   - Complete lab work to screen for TB and hepatitis --> We will consider starting you om Humira or Cosentyx for better long term control/ treatment  - Follow-up:   - Return for follow-up appointment in 2-3 months  Please reach out if you have any questions or concerns.  Best Regards,  Dr. Louana Roup Dermatology    Important Information   Due to recent changes in healthcare laws, you may see results of your pathology and/or laboratory studies on MyChart before the doctors have had a chance to review them. We understand that in some cases there may be results that are confusing or concerning to you. Please understand that not all results are received at the same time and often the doctors may need to interpret multiple results in order to provide you with the best plan of care or course of treatment. Therefore, we ask that you please give us  2 business days to thoroughly review all your results before contacting the office for clarification. Should we see a critical lab result, you will be contacted sooner.     If You Need Anything After Your Visit   If you have any questions or concerns for your doctor, please call our main line at [phone]. If no one answers, please leave a voicemail as directed and we will return your call as soon as possible. Messages left after 4 pm will be answered the following business day.    You may also send us  a message via MyChart.  We typically respond to MyChart messages within 1-2 business days.  For prescription refills, please ask your pharmacy to contact our office. Our fax number is [phone].  If you have an urgent issue when the clinic is closed that cannot wait until the next business day, you can page your doctor at the number below.     Please note that while we do our best to be available for urgent issues outside of office hours, we are not available 24/7.    If you have an urgent issue and are unable to reach us , you may choose to seek medical care at your doctor's office, retail clinic, urgent care center, or emergency room.   If you have a medical emergency, please immediately call 911 or go to the emergency department. In the event of inclement weather, please call our main line at [phone] for an update on the status of any delays or closures.  Dermatology Medication Tips: Please keep the boxes that topical medications come in in order to help keep track of the instructions about where and how to use these. Pharmacies typically print the medication instructions only on the boxes and not directly on the medication tubes.   If your medication is too expensive, please contact our office at [phone] or send us  a message through MyChart.    We are unable  to tell what your co-pay for medications will be in advance as this is different depending on your insurance coverage. However, we may be able to find a substitute medication at lower cost or fill out paperwork to get insurance to cover a needed medication.    If a prior authorization is required to get your medication covered by your insurance company, please allow us  1-2 business days to complete this process.   Drug prices often vary depending on where the prescription is filled and some pharmacies may offer cheaper prices.   The website www.goodrx.com contains coupons for medications through different pharmacies. The prices here do not  account for what the cost may be with help from insurance (it may be cheaper with your insurance), but the website can give you the price if you did not use any insurance.  - You can print the associated coupon and take it with your prescription to the pharmacy.  - You may also stop by our office during regular business hours and pick up a GoodRx coupon card.  - If you need your prescription sent electronically to a different pharmacy, notify our office through [facility] or by phone at [phone]

## 2024-04-30 ENCOUNTER — Encounter (HOSPITAL_BASED_OUTPATIENT_CLINIC_OR_DEPARTMENT_OTHER): Payer: Self-pay | Admitting: General Surgery

## 2024-05-07 ENCOUNTER — Encounter (HOSPITAL_BASED_OUTPATIENT_CLINIC_OR_DEPARTMENT_OTHER): Admission: RE | Payer: Self-pay | Source: Home / Self Care

## 2024-05-07 ENCOUNTER — Ambulatory Visit (HOSPITAL_BASED_OUTPATIENT_CLINIC_OR_DEPARTMENT_OTHER): Admission: RE | Admit: 2024-05-07 | Payer: Self-pay | Source: Home / Self Care | Admitting: General Surgery

## 2024-05-07 HISTORY — DX: Hidradenitis suppurativa: L73.2

## 2024-05-07 SURGERY — REPAIR, HERNIA, INGUINAL, ADULT
Anesthesia: General

## 2024-05-20 ENCOUNTER — Ambulatory Visit: Admitting: Physician Assistant

## 2024-06-05 ENCOUNTER — Encounter: Payer: Self-pay | Admitting: Dermatology

## 2024-06-05 ENCOUNTER — Ambulatory Visit: Admitting: Dermatology

## 2024-06-05 DIAGNOSIS — L732 Hidradenitis suppurativa: Secondary | ICD-10-CM

## 2024-06-05 DIAGNOSIS — L739 Follicular disorder, unspecified: Secondary | ICD-10-CM

## 2024-06-05 DIAGNOSIS — L723 Sebaceous cyst: Secondary | ICD-10-CM | POA: Diagnosis not present

## 2024-06-05 MED ORDER — TRIAMCINOLONE ACETONIDE 40 MG/ML IJ SUSP
40.0000 mg | Freq: Once | INTRAMUSCULAR | Status: AC
  Administered 2024-06-05: 40 mg via INTRAMUSCULAR

## 2024-06-05 MED ORDER — SULFAMETHOXAZOLE-TRIMETHOPRIM 800-160 MG PO TABS: 1.0000 | ORAL_TABLET | Freq: Two times a day (BID) | ORAL | 9 refills | Status: AC

## 2024-06-05 MED ORDER — CLINDAMYCIN PHOSPHATE 1 % EX SWAB: 1.0000 | Freq: Every day | CUTANEOUS | 11 refills | Status: AC

## 2024-06-05 NOTE — Patient Instructions (Addendum)
 Date: Wed Jun 05 2024  Dear Mr. Whidden,  Thank you for visiting today. Here is a summary of the key instructions:  - Medications: Use clindamycin  swabs on affected areas:   - Apply to back of leg first, then groin area   - Use one swab for two areas  - CeraVe Wash Instructions:   - Continue using CeraVe benzoyl peroxide wash daily   - Let it sit for 30-60 seconds before rinsing  - Current Medications:   - Restart taking Bactrim  as prescribed for flares   - Apply clindamycin  to the inflamed area on your leg  - Lab Tests:   - Get lab work done for [employer] medication   - Complete tests as soon as possible while on Medicaid  - Lifestyle Changes:   - Quit smoking or reduce smoking as much as possible  - Follow-up:   - Return for a follow-up appointment in 8 weeks  - Pharmacy Information:   - Ask the pharmacist to process prescriptions through GoodRx for potential savings  Please reach out if you have any questions or concerns.  Warm regards,  Dr. Delon Lenis Dermatology      Important Information   Due to recent changes in healthcare laws, you may see results of your pathology and/or laboratory studies on MyChart before the doctors have had a chance to review them. We understand that in some cases there may be results that are confusing or concerning to you. Please understand that not all results are received at the same time and often the doctors may need to interpret multiple results in order to provide you with the best plan of care or course of treatment. Therefore, we ask that you please give us  2 business days to thoroughly review all your results before contacting the office for clarification. Should we see a critical lab result, you will be contacted sooner.     If You Need Anything After Your Visit   If you have any questions or concerns for your doctor, please call our main line at [phone]. If no one answers, please leave a voicemail as directed and we  will return your call as soon as possible. Messages left after 4 pm will be answered the following business day.    You may also send us  a message via MyChart. We typically respond to MyChart messages within 1-2 business days.  For prescription refills, please ask your pharmacy to contact our office. Our fax number is [phone].  If you have an urgent issue when the clinic is closed that cannot wait until the next business day, you can page your doctor at the number below.     Please note that while we do our best to be available for urgent issues outside of office hours, we are not available 24/7.    If you have an urgent issue and are unable to reach us , you may choose to seek medical care at your doctor's office, retail clinic, urgent care center, or emergency room.   If you have a medical emergency, please immediately call 911 or go to the emergency department. In the event of inclement weather, please call our main line at [phone] for an update on the status of any delays or closures.  Dermatology Medication Tips: Please keep the boxes that topical medications come in in order to help keep track of the instructions about where and how to use these. Pharmacies typically print the medication instructions only on the boxes and not  directly on the medication tubes.   If your medication is too expensive, please contact our office at [phone] or send us  a message through MyChart.    We are unable to tell what your co-pay for medications will be in advance as this is different depending on your insurance coverage. However, we may be able to find a substitute medication at lower cost or fill out paperwork to get insurance to cover a needed medication.    If a prior authorization is required to get your medication covered by your insurance company, please allow us  1-2 business days to complete this process.   Drug prices often vary depending on where the prescription is filled and  some pharmacies may offer cheaper prices.   The website www.goodrx.com contains coupons for medications through different pharmacies. The prices here do not account for what the cost may be with help from insurance (it may be cheaper with your insurance), but the website can give you the price if you did not use any insurance.  - You can print the associated coupon and take it with your prescription to the pharmacy.  - You may also stop by our office during regular business hours and pick up a GoodRx coupon card.  - If you need your prescription sent electronically to a different pharmacy, notify our office through [facility] or by phone at [phone]

## 2024-06-05 NOTE — Progress Notes (Signed)
 Follow-Up Visit   Subjective  Brian Hodges is a 38 y.o. male who presents for the following: HS  Patient present today for follow up visit for HS. Patient was last evaluated on 03/06/24. At this visit patient was prescribed Clindamycin  lotion and batrim to take at the first sign flare. Patient reports sxs are unchanged. He is currently having a flare. He reports he recently changed insurances so he wasn't able to fill his previous prescription. Patient denies medication changes.  The following portions of the chart were reviewed this encounter and updated as appropriate: medications, allergies, medical history  Review of Systems:  No other skin or systemic complaints except as noted in HPI or Assessment and Plan.  Objective  Well appearing patient in no apparent distress; mood and affect are within normal limits.  A focused examination was performed of the following areas: Right Thigh, Groin and Chin  Relevant exam findings are noted in the Assessment and Plan.     Assessment & Plan   HIDRADENITIS SUPPURATIVA  Assessment: Patient presents with recurrent flares of an inflammatory skin condition, likely hidradenitis suppurativa, affecting the groin and back of the leg. Non-adherence to previously prescribed Bactrim  due to reported insurance issues. Using benzoyl peroxide wash and has clindamycin  at home, but unclear if using as directed. Condition appears poorly controlled with current management. Patient reports having quit smoking as previously recommended. Today I emphasized pro-inflammatory effects of nicotine and its potential to interfere with treatment efficacy. Previous lab work ordered to assess eligibility for advanced therapies was not completed.  - Plan:    Long term therapy is to Prescribe Cosentyx (secukinumab) injectable for better control of inflammation    Order specific lab tests required to start Cosentyx    Refill Bactrim  prescription    Continue benzoyl  peroxide wash (CeraVe brand) daily, allowing it to sit for 30-60 seconds before rinsing    Apply clindamycin  topically to affected areas, including groin and back of leg    Administer intralesional steroid injections to active lesions for acute management    Provide clindamycin  in swab form for easier application    Educate patient on using GoodRx for potential cost savings on prescriptions    Strongly encourage continued smoking cessation efforts  Follow up in 8 weeks to assess treatment efficacy and initiate Cosentyx if appropriate.  INFLAMED SEBACEOUS CYST (4) Left Genitocrural Fold, Left Medial Thigh, Right Buttock, Right Medial Thigh Procedure Note Intralesional Injection  Location: Right Thigh, Left Thigh, Left Inguinal Crease  Informed Consent: Discussed risks (infection, pain, bleeding, bruising, thinning of the skin, loss of skin pigment, lack of resolution, and recurrence of lesion) and benefits of the procedure, as well as the alternatives. Informed consent was obtained. Preparation: The area was prepared a standard fashion.  Anesthesia:None  Procedure Details: An intralesional injection was performed with Kenalog  40 mg/cc. 0.5 cc in total were injected. NDC #: 9996-9706-71 Exp: 04/06/2025  Total number of injections: 6  Plan: The patient was instructed on post-op care. Recommend OTC analgesia as needed for pain.  Related Medications triamcinolone  acetonide (KENALOG -40) injection 40 mg  HIDRADENITIS SUPPURATIVA   Related Medications clindamycin  (CLEOCIN  T) 1 % SWAB Apply 1 Application topically daily. Swab beard and groin after washing daily sulfamethoxazole -trimethoprim  (BACTRIM  DS) 800-160 MG tablet Take 1 tablet by mouth 2 (two) times daily. Take 2 times DAILY WITH HEAVY MEAL AT THE FIRST SIGN OF FLARE FOR 7 DAYS FOLLICULITIS   Related Medications clindamycin  (CLEOCIN  T) 1 % SWAB  Apply 1 Application topically daily. Swab beard and groin after washing  daily  No follow-ups on file.  I, Jetta Ager, am acting as Neurosurgeon for [employer], DO.  Documentation: I have reviewed the above documentation for accuracy and completeness, and I agree with the above.  Delon Lenis, DO

## 2024-07-25 LAB — QUANTIFERON-TB GOLD PLUS
QuantiFERON Mitogen Value: >10 [IU]/mL
QuantiFERON Nil Value: 0.04 [IU]/mL
QuantiFERON TB1 Ag Value: 0.06 [IU]/mL
QuantiFERON TB2 Ag Value: 0.07 [IU]/mL
QuantiFERON-TB Gold Plus: NEGATIVE

## 2024-07-25 LAB — ACUTE HEP PANEL AND HEP B SURFACE AB
Hep A IgM: NEGATIVE
Hep B C IgM: NEGATIVE
Hep C Virus Ab: NONREACTIVE
Hepatitis B Surf Ab Quant: 379 m[IU]/mL
Hepatitis B Surface Ag: NEGATIVE

## 2024-07-29 ENCOUNTER — Ambulatory Visit: Payer: Self-pay | Admitting: Dermatology

## 2024-07-29 DIAGNOSIS — L732 Hidradenitis suppurativa: Secondary | ICD-10-CM

## 2024-08-01 ENCOUNTER — Encounter: Payer: Self-pay | Admitting: Dermatology

## 2024-08-01 ENCOUNTER — Ambulatory Visit: Admitting: Dermatology

## 2024-08-01 VITALS — BP 128/78

## 2024-08-01 DIAGNOSIS — L732 Hidradenitis suppurativa: Secondary | ICD-10-CM | POA: Diagnosis not present

## 2024-08-01 NOTE — Patient Instructions (Addendum)
 Date: Thu Aug 01 2024  Hello Brian Hodges,  Thank you for visiting today. Here is a summary of the key instructions:  - Medications:   - Start Cosentyx as prescribed   - Continue washing with benzoyl peroxide   - Continue applying clindamycin   - Treatment Plan:   - Sign paperwork for Cosentyx approval   - Wait for approval call (about 3 weeks)   - Attend injection training appointment when called  - Cosentyx Instructions:   - Store syringes in the refrigerator   - Take out a few hours before injection   - Inject in abdomen or leg (or arm if someone else is injecting)   - Wait to inject if you have a cold or flu   - Wear masks when traveling or in close contact with others  - Follow-up:   - Return in 6 months for follow-up appointment   - Expect to be clear or remain clear by next appointment  - Other Instructions:   - Keep phone available and voicemail box not full   - Notify pharmacy of any address changes   - Answer pharmacy calls to confirm delivery each month   - Get screened for TB every year  Please reach out if you have any questions or concerns.  Warm regards,  Dr. Delon Lenis [facility]   Important Information  Due to recent changes in healthcare laws, you may see results of your pathology and/or laboratory studies on MyChart before the doctors have had a chance to review them. We understand that in some cases there may be results that are confusing or concerning to you. Please understand that not all results are received at the same time and often the doctors may need to interpret multiple results in order to provide you with the best plan of care or course of treatment. Therefore, we ask that you please give us  2 business days to thoroughly review all your results before contacting the office for clarification. Should we see a critical lab result, you will be contacted sooner.   If You Need Anything After Your Visit  If you have any questions or concerns for  your doctor, please call our main line at [phone] If no one answers, please leave a voicemail as directed and we will return your call as soon as possible. Messages left after 4 pm will be answered the following business day.   You may also send us  a message via MyChart. We typically respond to MyChart messages within 1-2 business days.  For prescription refills, please ask your pharmacy to contact our office. Our fax number is [phone].  If you have an urgent issue when the clinic is closed that cannot wait until the next business day, you can page your doctor at the number below.    Please note that while we do our best to be available for urgent issues outside of office hours, we are not available 24/7.   If you have an urgent issue and are unable to reach us , you may choose to seek medical care at your doctor's office, retail clinic, urgent care center, or emergency room.  If you have a medical emergency, please immediately call 911 or go to the emergency department. In the event of inclement weather, please call our main line at [phone] for an update on the status of any delays or closures.  Dermatology Medication Tips: Please keep the boxes that topical medications come in in order to help keep track of the  instructions about where and how to use these. Pharmacies typically print the medication instructions only on the boxes and not directly on the medication tubes.   If your medication is too expensive, please contact our office at [phone] or send us  a message through MyChart.   We are unable to tell what your co-pay for medications will be in advance as this is different depending on your insurance coverage. However, we may be able to find a substitute medication at lower cost or fill out paperwork to get insurance to cover a needed medication.   If a prior authorization is required to get your medication covered by your insurance company, please allow us  1-2 business  days to complete this process.  Drug prices often vary depending on where the prescription is filled and some pharmacies may offer cheaper prices.  The website www.goodrx.com contains coupons for medications through different pharmacies. The prices here do not account for what the cost may be with help from insurance (it may be cheaper with your insurance), but the website can give you the price if you did not use any insurance.  - You can print the associated coupon and take it with your prescription to the pharmacy.  - You may also stop by our office during regular business hours and pick up a GoodRx coupon card.  - If you need your prescription sent electronically to a different pharmacy, notify our office through [facility] or by phone at [phone]

## 2024-08-01 NOTE — Progress Notes (Signed)
   Follow-Up Visit   Subjective  Brian Hodges is a 38 y.o. male established patient who presents for FOLLOW UP on the diagnoses listed below:  Patient was last evaluated on 06/05/24.   HS: Rx clindamycin  swabs to use daily. Recommended washing areas with BPO. Patient reports sxs are better. He has not flared since last OV. He mentioned that he has taken time off from work the past month and feels that may be contributing to less flares as he is not moving and sweating as much.    The following portions of the chart were reviewed this encounter and updated as appropriate: medications, allergies, medical history  Review of Systems:  No other skin or systemic complaints except as noted in HPI or Assessment and Plan.  Objective  Well appearing patient in no apparent distress; mood and affect are within normal limits.   A focused examination was performed of the following areas:   Relevant exam findings are noted in the Assessment and Plan.    Assessment & Plan   HIDRADENITIS SUPPURATIVA - Assessment: Patient's condition has improved since the last visit in July. Previous interventions, including injections, stress reduction, and topical treatments have been effective in calming the inflammation. Current examination reveals clear inner thighs with some scarring but no active lesions. A previously noted lesion on the patient's bottom has resolved. Lab work for Cosentyx initiation has been completed. - Plan:    Initiate Cosentyx treatment with completed paperwork and prescription    Schedule injection training appointment upon approval in approximately 3 weeks    Educate on injection frequency: every 2 weeks initially, then monthly or biweekly based on response    Instruct on injection sites: abdomen or leg for self-administration    Store medication in refrigerator; remove a few hours before injection    Continue current topical regimen with benzoyl peroxide wash and clindamycin   application    Monitor for increased susceptibility to infections and delay injection if experiencing illness    Annual TB screening required  Follow-up in 6 months to assess response to Cosentyx treatment and monitor for clearance or maintenance of clear skin.    Return in 6 months (on 01/29/2025) for HS.   Documentation: I have reviewed the above documentation for accuracy and completeness, and I agree with the above.  I, Shirron Maranda, CMA, am acting as scribe for [employer], DO.   Delon Lenis, DO

## 2024-08-22 MED ORDER — COSENTYX UNOREADY 300 MG/2ML ~~LOC~~ SOAJ: SUBCUTANEOUS | 12 refills | Status: DC

## 2024-08-22 MED ORDER — COSENTYX UNOREADY 300 MG/2ML ~~LOC~~ SOAJ: SUBCUTANEOUS | 0 refills | Status: DC

## 2024-11-30 ENCOUNTER — Emergency Department (HOSPITAL_BASED_OUTPATIENT_CLINIC_OR_DEPARTMENT_OTHER)
Admission: EM | Admit: 2024-11-30 | Discharge: 2024-11-30 | Disposition: A | Discharge status: Home / Self Care | Attending: Emergency Medicine | Admitting: Emergency Medicine

## 2024-11-30 ENCOUNTER — Encounter (HOSPITAL_BASED_OUTPATIENT_CLINIC_OR_DEPARTMENT_OTHER): Payer: Self-pay | Admitting: Emergency Medicine

## 2024-11-30 ENCOUNTER — Other Ambulatory Visit: Payer: Self-pay

## 2024-11-30 DIAGNOSIS — J029 Acute pharyngitis, unspecified: Secondary | ICD-10-CM | POA: Diagnosis present

## 2024-11-30 LAB — RESP PANEL BY RT-PCR (RSV, FLU A&B, COVID)  RVPGX2
Influenza A by PCR: NEGATIVE
Influenza B by PCR: NEGATIVE
Resp Syncytial Virus by PCR: NEGATIVE
SARS Coronavirus 2 by RT PCR: NEGATIVE

## 2024-11-30 LAB — GROUP A STREP BY PCR: Group A Strep by PCR: NOT DETECTED

## 2024-11-30 MED ORDER — KETOROLAC TROMETHAMINE 15 MG/ML IJ SOLN
15.0000 mg | Freq: Once | INTRAMUSCULAR | Status: AC
  Administered 2024-11-30: 15 mg via INTRAVENOUS
  Filled 2024-11-30: qty 1

## 2024-11-30 MED ORDER — ACETAMINOPHEN 325 MG PO TABS
650.0000 mg | ORAL_TABLET | Freq: Once | ORAL | Status: AC
  Administered 2024-11-30: 650 mg via ORAL
  Filled 2024-11-30: qty 2

## 2024-11-30 MED ORDER — AMOXICILLIN-POT CLAVULANATE 875-125 MG PO TABS: 1.0000 | ORAL_TABLET | Freq: Two times a day (BID) | ORAL | 0 refills | Status: AC

## 2024-11-30 MED ORDER — SODIUM CHLORIDE 0.9 % IV BOLUS
1000.0000 mL | Freq: Once | INTRAVENOUS | Status: AC
  Administered 2024-11-30: 1000 mL via INTRAVENOUS

## 2024-11-30 MED ORDER — DEXAMETHASONE SOD PHOSPHATE PF 10 MG/ML IJ SOLN
10.0000 mg | Freq: Once | INTRAMUSCULAR | Status: AC
  Administered 2024-11-30: 10 mg via INTRAVENOUS
  Filled 2024-11-30: qty 1

## 2024-11-30 NOTE — Discharge Instructions (Signed)
 Please read and follow all provided instructions.  Your diagnoses today include:  1. Pharyngitis, unspecified etiology     Tests performed today include: Strep test: was negative for strep throat Flu, COVID, RSV testing: Was negative Vital signs. See below for your results today.   Medications prescribed:  Augmentin  - antibiotic  You have been prescribed an antibiotic medicine: take the entire course of medicine even if you are feeling better. Stopping early can cause the antibiotic not to work.  Home care instructions:  Please read the educational materials provided and follow any instructions contained in this packet.  Follow-up instructions: Please follow-up with your primary care provider as needed for further evaluation of your symptoms.  Return instructions:  Please return to the Emergency Department if you experience worsening symptoms.  Return if you have worsening problems swallowing, your neck becomes swollen, you cannot swallow your saliva or your voice becomes muffled.  Return with high persistent fever, persistent vomiting, or if you have trouble breathing.  Please return if you have any other emergent concerns.  Additional Information:  Your vital signs today were: BP 131/82 (BP Location: Right Arm)   Pulse (!) 108   Temp 100.1 F (37.8 C) (Oral)   Resp 20   SpO2 98%  If your blood pressure (BP) was elevated above 135/85 this visit, please have this repeated by your doctor within one month. --------------

## 2024-11-30 NOTE — ED Triage Notes (Signed)
 Sore throat congestion fever since thursday

## 2024-11-30 NOTE — ED Notes (Signed)
 Reswab strep collected and sent to lab.

## 2024-11-30 NOTE — ED Provider Notes (Signed)
 " [place] EMERGENCY DEPARTMENT AT [facility] Provider Note   CSN: [id number] Arrival date & time: 11/30/24  1601     Patient presents with: Sore Throat   Brian Hodges is a 39 y.o. male.   Patient presents to the emergency department today for evaluation of sore throat.  Symptoms started 2 days ago.  Patient has had nasal congestion and a headache.  No significant cough.  He has had fevers to 102 F at home.  He is treating with over-the-counter medications.  He has been able to drink warm liquids such as tea to help maintain hydration, but swallowing is painful.  No voice change.  No difficulty breathing.       Prior to Admission medications  Medication Sig Start Date End Date Taking? Authorizing Provider  clindamycin  (CLEOCIN  T) 1 % SWAB Apply 1 Application topically daily. Swab beard and groin after washing daily 06/05/24   Alm Delon SAILOR, DO  Secukinumab  (COSENTYX  UNOREADY) 300 MG/2ML SOAJ Maintenance - inject 300mg  subcutaneously on week 4, then every 4 weeks thereafter. 08/22/24   Alm Delon SAILOR, DO  Secukinumab  (COSENTYX  UNOREADY) 300 MG/2ML SOAJ Loading - inject 300mg  subcutaneously on weeks 0, 1, 2, 3 08/22/24   Alm Delon SAILOR, DO  sulfamethoxazole -trimethoprim  (BACTRIM  DS) 800-160 MG tablet Take 1 tablet by mouth 2 (two) times daily. Take 2 times DAILY WITH HEAVY MEAL AT THE FIRST SIGN OF FLARE FOR 7 DAYS 06/05/24   Alm Delon SAILOR, DO    Allergies: Patient has no known allergies.    Review of Systems  Updated Vital Signs BP 131/82 (BP Location: Right Arm)   Pulse (!) 108   Temp 100.1 F (37.8 C) (Oral)   Resp 20   SpO2 98%   Physical Exam Vitals and nursing note reviewed.  Constitutional:      Appearance: He is well-developed.  HENT:     Head: Normocephalic and atraumatic.     Jaw: No trismus.     Right Ear: Tympanic membrane, ear canal and external ear normal.     Left Ear: Tympanic membrane, ear canal and external ear normal.      Nose: Congestion present. No mucosal edema or rhinorrhea.     Mouth/Throat:     Mouth: Mucous membranes are not dry.     Pharynx: Uvula midline. Pharyngeal swelling, oropharyngeal exudate and posterior oropharyngeal erythema present. No uvula swelling.     Tonsils: No tonsillar abscesses.     Comments: Uvula appears midline, no obvious peritonsillar abscess.  Voice does not sound muffled. Eyes:     General:        Right eye: No discharge.        Left eye: No discharge.     Conjunctiva/sclera: Conjunctivae normal.  Cardiovascular:     Rate and Rhythm: Normal rate and regular rhythm.     Heart sounds: Normal heart sounds.  Pulmonary:     Effort: Pulmonary effort is normal. No respiratory distress.     Breath sounds: Normal breath sounds. No wheezing or rales.  Abdominal:     Palpations: Abdomen is soft.     Tenderness: There is no abdominal tenderness.  Musculoskeletal:     Cervical back: Normal range of motion and neck supple.  Skin:    General: Skin is warm and dry.  Neurological:     Mental Status: He is alert.     (all labs ordered are listed, but only abnormal results are displayed) Labs Reviewed  GROUP  A STREP BY PCR  RESP PANEL BY RT-PCR (RSV, FLU A&B, COVID)  [id number]    EKG: None  Radiology: No results found.   Procedures   Medications Ordered in the ED  sodium chloride  0.9 % bolus 1,000 mL (1,000 mLs Intravenous New Bag/Given 11/30/24 1649)  ketorolac  (TORADOL ) 15 MG/ML injection 15 mg (15 mg Intravenous Given 11/30/24 1650)  dexamethasone  (DECADRON ) injection 10 mg (10 mg Intravenous Given 11/30/24 1649)  acetaminophen  (TYLENOL ) tablet 650 mg (650 mg Oral Given 11/30/24 1649)   ED Course  Patient seen and examined. History obtained directly from patient.   Labs/EKG: Ordered strep, flu, COVID, RSV  Imaging: None ordered  Medications/Fluids: Patient with low-grade fever and tachycardia.  He does have significant erythema and swelling in the oropharynx.  He  would likely benefit from IV hydration, IV Toradol /Decadron .  Awaiting testing.  Most recent vital signs reviewed and are as follows: BP 131/82 (BP Location: Right Arm)   Pulse (!) 108   Temp 100.1 F (37.8 C) (Oral)   Resp 20   SpO2 98%   Initial impression: Pharyngitis  ///  Reassessment performed. Patient appears clinically improved.  Currently sweating, fever is likely breaking.  Labs personally reviewed and interpreted including: Flu, COVID, RSV testing was negative.  Strep test was negative.  Reviewed pertinent lab work and imaging with patient at bedside. Questions answered.   Most current vital signs reviewed and are as follows: BP 109/68 (BP Location: Right Arm)   Pulse 96   Temp 100.1 F (37.8 C) (Oral)   Resp 18   SpO2 99%   Plan: Discharge to home.   Prescriptions written for: Augmentin .  Other home care instructions discussed: Maintain good hydration, OTC meds for pain.  ED return instructions discussed: Return with worsening pain, difficulty breathing or swallowing, persistent vomiting.  Follow-up instructions discussed: Patient encouraged to follow-up with their PCP in 3 days if not improving.                                   Medical Decision Making Risk OTC drugs. Prescription drug management.   In regards to the patient's sore throat today, the following dangerous and potentially life threatening etiologies were considered on the differential diagnosis: Lugwig's angina, uvulitis, epiglottis, peritonsillar abscess, retropharyngeal abscess, Lemierre's syndrome. Also considered were more common causes such as: streptococcal pharyngitis, gonococcal pharyngitis, non-bacterial pharyngitis (cold viruses, HSV/coxsackievirus, influenza, COVID-19, infectious mononucleosis, oropharyngeal candidiasis), and other non-infectious causes including seasonal allergies/post-nasal drip, GERD/esophagitis, trauma.   Vital signs improved with IV hydration.  Pain controlled  with IV Toradol .  Patient also given a dose of IV Decadron .  He was started on Augmentin .  Given clinical improvement, did not feel that he would benefit from advanced imaging at this time unless symptoms were to worsen.  Patient agreeable with plan.  The patient's vital signs, pertinent lab work and imaging were reviewed and interpreted as discussed in the ED course. Hospitalization was considered for further testing, treatments, or serial exams/observation. However as patient is well-appearing, has a stable exam, and reassuring studies today, I do not feel that they warrant admission at this time. This plan was discussed with the patient who verbalizes agreement and comfort with this plan and seems reliable and able to return to the Emergency Department with worsening or changing symptoms.       Final diagnoses:  Pharyngitis, unspecified etiology    ED Discharge Orders  Ordered    amoxicillin -clavulanate (AUGMENTIN ) 875-125 MG tablet  Every 12 hours        11/30/24 1820               Desiderio Chew, PA-C 11/30/24 1927  "

## 2024-12-03 ENCOUNTER — Emergency Department (HOSPITAL_BASED_OUTPATIENT_CLINIC_OR_DEPARTMENT_OTHER)
Admission: EM | Admit: 2024-12-03 | Discharge: 2024-12-03 | Disposition: A | Discharge status: Home / Self Care | Attending: Emergency Medicine | Admitting: Emergency Medicine

## 2024-12-03 ENCOUNTER — Other Ambulatory Visit: Payer: Self-pay

## 2024-12-03 ENCOUNTER — Encounter (HOSPITAL_BASED_OUTPATIENT_CLINIC_OR_DEPARTMENT_OTHER): Payer: Self-pay

## 2024-12-03 DIAGNOSIS — J3489 Other specified disorders of nose and nasal sinuses: Secondary | ICD-10-CM | POA: Insufficient documentation

## 2024-12-03 DIAGNOSIS — R0981 Nasal congestion: Secondary | ICD-10-CM | POA: Insufficient documentation

## 2024-12-03 DIAGNOSIS — R0982 Postnasal drip: Secondary | ICD-10-CM | POA: Diagnosis not present

## 2024-12-03 DIAGNOSIS — R110 Nausea: Secondary | ICD-10-CM | POA: Insufficient documentation

## 2024-12-03 DIAGNOSIS — J029 Acute pharyngitis, unspecified: Secondary | ICD-10-CM | POA: Insufficient documentation

## 2024-12-03 LAB — CBC WITH DIFFERENTIAL/PLATELET
Abs Immature Granulocytes: 0.03 10*3/uL (ref 0.00–0.07)
Basophils Absolute: 0.1 10*3/uL (ref 0.0–0.1)
Basophils Relative: 1 %
Eosinophils Absolute: 0.1 10*3/uL (ref 0.0–0.5)
Eosinophils Relative: 1 %
HCT: 39.7 % (ref 39.0–52.0)
Hemoglobin: 13.2 g/dL (ref 13.0–17.0)
Immature Granulocytes: 0 %
Lymphocytes Relative: 18 %
Lymphs Abs: 1.9 10*3/uL (ref 0.7–4.0)
MCH: 28.8 pg (ref 26.0–34.0)
MCHC: 33.2 g/dL (ref 30.0–36.0)
MCV: 86.5 fL (ref 80.0–100.0)
Monocytes Absolute: 1 10*3/uL (ref 0.1–1.0)
Monocytes Relative: 10 %
Neutro Abs: 7.2 10*3/uL (ref 1.7–7.7)
Neutrophils Relative %: 70 %
Platelets: 272 10*3/uL (ref 150–400)
RBC: 4.59 MIL/uL (ref 4.22–5.81)
RDW: 13.6 % (ref 11.5–15.5)
WBC: 10.2 10*3/uL (ref 4.0–10.5)
nRBC: 0 % (ref 0.0–0.2)

## 2024-12-03 LAB — COMPREHENSIVE METABOLIC PANEL WITH GFR
ALT: 119 U/L — ABNORMAL HIGH (ref 0–44)
AST: 66 U/L — ABNORMAL HIGH (ref 15–41)
Albumin: 3.8 g/dL (ref 3.5–5.0)
Alkaline Phosphatase: 67 U/L (ref 38–126)
Anion gap: 13 (ref 5–15)
BUN: 9 mg/dL (ref 6–20)
CO2: 24 mmol/L (ref 22–32)
Calcium: 9.1 mg/dL (ref 8.9–10.3)
Chloride: 96 mmol/L — ABNORMAL LOW (ref 98–111)
Creatinine, Ser: 0.84 mg/dL (ref 0.61–1.24)
GFR, Estimated: >60 mL/min
Glucose, Bld: 112 mg/dL — ABNORMAL HIGH (ref 70–99)
Potassium: 4.1 mmol/L (ref 3.5–5.1)
Sodium: 132 mmol/L — ABNORMAL LOW (ref 135–145)
Total Bilirubin: 0.6 mg/dL (ref 0.0–1.2)
Total Protein: 7.9 g/dL (ref 6.5–8.1)

## 2024-12-03 LAB — MONONUCLEOSIS SCREEN: Mono Screen: NEGATIVE

## 2024-12-03 MED ORDER — KETOROLAC TROMETHAMINE 30 MG/ML IJ SOLN
30.0000 mg | Freq: Once | INTRAMUSCULAR | Status: AC
  Administered 2024-12-03: 30 mg via INTRAVENOUS
  Filled 2024-12-03: qty 1

## 2024-12-03 MED ORDER — LIDOCAINE VISCOUS HCL 2 % MT SOLN
15.0000 mL | Freq: Once | OROMUCOSAL | Status: AC
  Administered 2024-12-03: 15 mL via OROMUCOSAL
  Filled 2024-12-03: qty 15

## 2024-12-03 MED ORDER — ONDANSETRON HCL 4 MG/2ML IJ SOLN
4.0000 mg | Freq: Once | INTRAMUSCULAR | Status: AC
  Administered 2024-12-03: 4 mg via INTRAVENOUS
  Filled 2024-12-03: qty 2

## 2024-12-03 MED ORDER — SODIUM CHLORIDE 0.9 % IV BOLUS
1000.0000 mL | Freq: Once | INTRAVENOUS | Status: AC
  Administered 2024-12-03: 1000 mL via INTRAVENOUS

## 2024-12-03 MED ORDER — LIDOCAINE VISCOUS HCL 2 % MT SOLN: 10.0000 mL | Freq: Four times a day (QID) | OROMUCOSAL | 0 refills | Status: AC | PRN

## 2024-12-03 MED ORDER — DEXAMETHASONE SOD PHOSPHATE PF 10 MG/ML IJ SOLN
10.0000 mg | Freq: Once | INTRAMUSCULAR | Status: AC
  Administered 2024-12-03: 10 mg via INTRAVENOUS
  Filled 2024-12-03: qty 1

## 2024-12-03 NOTE — Discharge Instructions (Addendum)
 You have been seen and discharged from the emergency department.  Continue taking your antibiotic as directed.  You may take Tylenol /ibuprofen  for pain relief.  You may gargle and use the viscous lidocaine  as needed only for the next 2 to 3 days.  If you are not improving in the next 48 hours you need to be reevaluated.  If you start developing unilateral neck/throat swelling or difficulty swallowing you need to return to an ER immediately.  Follow-up with your primary provider for further evaluation and further care. Take home medications as prescribed. If you have any worsening symptoms or further concerns for your health please return to an emergency department for further evaluation.

## 2024-12-03 NOTE — ED Triage Notes (Signed)
 Pt seen here 3 days ago and diagnosed with pharyngitis. Pt reports significant congestion, drainage, and sore throat. Denies fever. No acute respiratory distress noted.

## 2024-12-03 NOTE — ED Provider Notes (Signed)
 " Brian Hodges Provider Note   CSN: [id number] Arrival date & time: 12/03/24  9350     Patient presents with: Nasal Congestion   Brian Hodges is a 39 y.o. male.   HPI   39 year old male presents emergency department with ongoing sore throat and difficulty swallowing.  Was seen about 3 days ago, had a negative respiratory panel negative strep test however had an exam concerning for strep throat.  Placed on Augmentin .  Patient has been able to tolerate the medicine however has had difficulty swallowing otherwise.  He is now complaining of increased nasal congestion/mucus production and postnasal drip of which she is having to spit out because he cannot swallow.  Denies any ongoing fevers.  Does not feel like his throat/neck is more swollen on 1 side.  Complains of mild nausea but no abdominal pain.  Prior to Admission medications  Medication Sig Start Date End Date Taking? Authorizing Provider  amoxicillin -clavulanate (AUGMENTIN ) 875-125 MG tablet Take 1 tablet by mouth every 12 (twelve) hours. 11/30/24   Brian Hodges, Joshua, Brian Hodges  clindamycin  (CLEOCIN  T) 1 % SWAB Apply 1 Application topically daily. Swab beard and groin after washing daily 06/05/24   Brian Delon SAILOR, DO  Secukinumab  (COSENTYX  UNOREADY) 300 MG/2ML SOAJ Maintenance - inject 300mg  subcutaneously on week 4, then every 4 weeks thereafter. 08/22/24   Brian Delon SAILOR, DO  Secukinumab  (COSENTYX  UNOREADY) 300 MG/2ML SOAJ Loading - inject 300mg  subcutaneously on weeks 0, 1, 2, 3 08/22/24   Brian Delon SAILOR, DO  sulfamethoxazole -trimethoprim  (BACTRIM  DS) 800-160 MG tablet Take 1 tablet by mouth 2 (two) times daily. Take 2 times DAILY WITH HEAVY MEAL AT THE FIRST SIGN OF FLARE FOR 7 DAYS 06/05/24   Brian Delon SAILOR, DO    Allergies: Patient has no known allergies.    Review of Systems  Constitutional:  Negative for fever.  HENT:  Positive for postnasal drip, rhinorrhea, sinus pressure,  sore throat and trouble swallowing.   Respiratory:  Negative for shortness of breath.   Cardiovascular:  Negative for chest pain.  Gastrointestinal:  Negative for abdominal pain, diarrhea and vomiting.  Skin:  Negative for rash.  Neurological:  Negative for headaches.    Updated Vital Signs BP (!) 143/85 (BP Location: Right Arm)   Pulse 98   Temp 98.4 F (36.9 C) (Oral)   Resp 18   Ht 5' 10 (1.778 m)   Wt 117.9 kg   SpO2 100%   BMI 37.31 kg/m   Physical Exam Vitals and nursing note reviewed.  Constitutional:      General: He is not in acute distress.    Appearance: Normal appearance.  HENT:     Head: Normocephalic.     Right Ear: Tympanic membrane and external ear normal.     Left Ear: Tympanic membrane and external ear normal.     Nose: Congestion and rhinorrhea present.     Mouth/Throat:     Mouth: Mucous membranes are moist.     Pharynx: Oropharyngeal exudate and posterior oropharyngeal erythema present.     Comments: Uvula is midline, extensive erythema of the posterior oropharynx with noted exudates, no unilateral tonsillar swelling Cardiovascular:     Rate and Rhythm: Normal rate.  Pulmonary:     Effort: Pulmonary effort is normal. No respiratory distress.  Abdominal:     Palpations: Abdomen is soft.     Tenderness: There is no abdominal tenderness.  Musculoskeletal:     Cervical back: No  rigidity.  Skin:    General: Skin is warm.  Neurological:     Mental Status: He is alert and oriented to person, place, and time. Mental status is at baseline.  Psychiatric:        Mood and Affect: Mood normal.     (all labs ordered are listed, but only abnormal results are displayed) Labs Reviewed  CBC WITH DIFFERENTIAL/PLATELET  COMPREHENSIVE METABOLIC PANEL WITH GFR  MONONUCLEOSIS SCREEN    EKG: None  Radiology: No results found.   Procedures   Medications Ordered in the ED  sodium chloride  0.9 % bolus 1,000 mL (1,000 mLs Intravenous New Bag/Given  12/03/24 0753)  ondansetron  (ZOFRAN ) injection 4 mg (4 mg Intravenous Given 12/03/24 0759)  ketorolac  (TORADOL ) 30 MG/ML injection 30 mg (30 mg Intravenous Given 12/03/24 0801)  dexamethasone  (DECADRON ) injection 10 mg (10 mg Intravenous Given 12/03/24 0801)  lidocaine  (XYLOCAINE ) 2 % viscous mouth solution 15 mL (15 mLs Mouth/Throat Given 12/03/24 0803)                                    Medical Decision Making Amount and/or Complexity of Data Reviewed Labs: ordered.  Risk Prescription drug management.   39 year old male presents emergency department with ongoing throat pain and difficulty swallowing.  Was seen 3 days ago, diagnosed with pharyngitis.  Respiratory panel and strep swab was negative at that time but he had a concerning exam and was treated with Augmentin .  He states that he is tolerating the medication but has otherwise had a significant decrease in p.o. intake secondary to pain.  He is now experiencing rhinorrhea, postnasal drip that he is having to spit out because he cannot swallow.  Denies any unilateral swelling.  No longer have any fevers.  Vitals are normal and stable.  He has a very erythematous posterior oropharynx with exudates, uvula is midline, no unilateral swelling/findings of abscess.  There is symmetrical submandibular lymphadenopathy and otherwise no other unilateral findings of the neck.  Intermittently tachycardic while talking to me.  Blood work is baseline and reassuring.  Monospot test is negative.  After IV fluids, medication and some viscous lidocaine  patient's feeling much improved.  Repeat exam of the oropharynx is reassuring without any unilateral findings, do not feel he warrants emergent CT at this time, low suspicion for abscess.  Discussed with the patient symptomatic treatment, pushing oral hydration, continuing antibiotics.  If he is not improving in the next 48 hours have given her strict return to ED precautions.  Patient at this time appears  safe and stable for discharge and close outpatient follow up. Discharge plan and strict return to ED precautions discussed, patient verbalizes understanding and agreement.     Final diagnoses:  None    ED Discharge Orders     None          Brian Roxie HERO, DO 12/03/24 1010  "

## 2024-12-12 ENCOUNTER — Other Ambulatory Visit: Payer: Self-pay

## 2024-12-12 DIAGNOSIS — L732 Hidradenitis suppurativa: Secondary | ICD-10-CM

## 2024-12-12 MED ORDER — COSENTYX UNOREADY 300 MG/2ML ~~LOC~~ SOAJ: SUBCUTANEOUS | 0 refills | Status: AC

## 2024-12-12 MED ORDER — COSENTYX UNOREADY 300 MG/2ML ~~LOC~~ SOAJ: SUBCUTANEOUS | 12 refills | Status: AC

## 2024-12-12 NOTE — Progress Notes (Signed)
 Pt has not received shipment. Per [employer] PA was approved for the dates of 09/17/24 - 09/17/25 and Rx was forwarded to [facility].   I have resent Rx's to [facility] again but via Epic.    Will provide update to pt once I make contact with [facility] during their business hours in regard to shipment delivery.

## 2024-12-12 NOTE — Progress Notes (Signed)
 CMA has reached out to [facility].  Representative stated that multiple attempts was made to pt to schedule shipment delivery but was unsuccessful. Representative stated that the case was closed. She verified that Rx's sent today was also received but they would need a copy of PA approval letter before reopening the case to proceed. She stated that this takes 24-48 hours and patient should contact them to schedule delivery after this time frame.   PA approval letter has been faxed.  Pt provided with update via MyChart correspondence in regard.

## 2025-01-29 ENCOUNTER — Ambulatory Visit: Admitting: Dermatology
